# Patient Record
Sex: Male | Born: 1965 | Race: White | Hispanic: No | Marital: Married | Smoking: Never smoker
Health system: Southern US, Community
[De-identification: ages and names within clinical notes are randomized; demographics above are authoritative.]

## PROBLEM LIST (undated history)

## (undated) DIAGNOSIS — G4730 Sleep apnea, unspecified: Secondary | ICD-10-CM

## (undated) DIAGNOSIS — Z8701 Personal history of pneumonia (recurrent): Secondary | ICD-10-CM

## (undated) DIAGNOSIS — J310 Chronic rhinitis: Secondary | ICD-10-CM

## (undated) HISTORY — DX: Personal history of pneumonia (recurrent): Z87.01

## (undated) HISTORY — DX: Sleep apnea, unspecified: G47.30

## (undated) HISTORY — PX: OTHER SURGICAL HISTORY: SHX169

## (undated) HISTORY — DX: Chronic rhinitis: J31.0

---

## 2011-01-06 DIAGNOSIS — Z8701 Personal history of pneumonia (recurrent): Secondary | ICD-10-CM

## 2011-01-06 HISTORY — DX: Personal history of pneumonia (recurrent): Z87.01

## 2015-09-20 DIAGNOSIS — R490 Dysphonia: Secondary | ICD-10-CM | POA: Diagnosis not present

## 2015-09-20 DIAGNOSIS — R1313 Dysphagia, pharyngeal phase: Secondary | ICD-10-CM | POA: Diagnosis not present

## 2015-09-20 MED FILL — FLUTICASONE PROP 50 MCG SPR: 50 | 30 days supply | Qty: 16 | Fill #0

## 2015-09-20 MED FILL — OMEPRAZOLE DR 40 MG CAPSULE: 40 | 30 days supply | Qty: 30 | Fill #0

## 2015-10-10 ENCOUNTER — Institutional Professional Consult (permissible substitution): Payer: Self-pay | Admitting: Pulmonary Disease

## 2015-10-11 ENCOUNTER — Other Ambulatory Visit (INDEPENDENT_AMBULATORY_CARE_PROVIDER_SITE_OTHER): Payer: 59

## 2015-10-11 ENCOUNTER — Ambulatory Visit (INDEPENDENT_AMBULATORY_CARE_PROVIDER_SITE_OTHER)
Admission: RE | Admit: 2015-10-11 | Discharge: 2015-10-11 | Disposition: A | Discharge status: Home / Self Care | Payer: 59 | Source: Ambulatory Visit | Attending: Pulmonary Disease | Admitting: Pulmonary Disease

## 2015-10-11 ENCOUNTER — Other Ambulatory Visit: Payer: Self-pay | Admitting: Pulmonary Disease

## 2015-10-11 ENCOUNTER — Telehealth: Payer: Self-pay | Admitting: Pulmonary Disease

## 2015-10-11 ENCOUNTER — Ambulatory Visit (INDEPENDENT_AMBULATORY_CARE_PROVIDER_SITE_OTHER): Payer: 59 | Admitting: Pulmonary Disease

## 2015-10-11 ENCOUNTER — Encounter: Payer: Self-pay | Admitting: Pulmonary Disease

## 2015-10-11 DIAGNOSIS — R05 Cough: Secondary | ICD-10-CM

## 2015-10-11 DIAGNOSIS — R059 Cough, unspecified: Secondary | ICD-10-CM

## 2015-10-11 DIAGNOSIS — K219 Gastro-esophageal reflux disease without esophagitis: Secondary | ICD-10-CM | POA: Diagnosis not present

## 2015-10-11 DIAGNOSIS — J310 Chronic rhinitis: Secondary | ICD-10-CM | POA: Insufficient documentation

## 2015-10-11 LAB — CBC WITH DIFFERENTIAL/PLATELET
BASOS ABS: 0 10*3/uL (ref 0.0–0.1)
BASOS PCT: 0.4 % (ref 0.0–3.0)
EOS ABS: 0.1 10*3/uL (ref 0.0–0.7)
Eosinophils Relative: 1.2 % (ref 0.0–5.0)
HEMATOCRIT: 45.2 % (ref 39.0–52.0)
HEMOGLOBIN: 15 g/dL (ref 13.0–17.0)
LYMPHS PCT: 26.8 % (ref 12.0–46.0)
Lymphs Abs: 1.8 10*3/uL (ref 0.7–4.0)
MCHC: 33.1 g/dL (ref 30.0–36.0)
MCV: 87 fl (ref 78.0–100.0)
Monocytes Absolute: 0.6 10*3/uL (ref 0.1–1.0)
Monocytes Relative: 9.2 % (ref 3.0–12.0)
Neutro Abs: 4.2 10*3/uL (ref 1.4–7.7)
Neutrophils Relative %: 62.4 % (ref 43.0–77.0)
Platelets: 238 10*3/uL (ref 150.0–400.0)
RBC: 5.19 Mil/uL (ref 4.22–5.81)
RDW: 13.8 % (ref 11.5–15.5)
WBC: 6.7 10*3/uL (ref 4.0–10.5)

## 2015-10-11 MED ORDER — FLUTICASONE PROPIONATE 50 MCG/ACT NA SUSP: 2.0000 | Freq: Every day | NASAL | 2 refills | Status: DC

## 2015-10-11 NOTE — Patient Instructions (Addendum)
   Call me if your cough changes or you develop any new breathing problems.   Try to remember where you had your breathing tests at so we can get your records.  Continue using the Flonase nasal spray.  I will see you back in 4 weeks to review your test results.  TESTS ORDERED: 1. Maxillofacial CT Scan Limited w/o Contrast 2. CXR PA/LAT today 3. Barium Swallow/Esophagram 4. Full PFTs before next appointment 5. Serum CBC w/ differential & RAST Panel

## 2015-10-11 NOTE — Telephone Encounter (Signed)
Pt wanting to know if he should cancel his ENT appt since he is having such an extensive workup through our clinic.    JN please advise on recs. Thanks!

## 2015-10-11 NOTE — Telephone Encounter (Signed)
It's entirely up to him. If he wants to push the appointment back a bit until we get the workup complete. Thanks.

## 2015-10-11 NOTE — Progress Notes (Signed)
Subjective:    Patient ID: Timothy Ho, male    DOB: 1966-01-04, 50 y.o.   MRN: [id number]  HPI The patient reports he has had a cough for months. He reports he has had this problem in the past with spontaneous resolution after long periods of time. He hasn't been able to pinpoint a specific season that it seems to start. He reports previously he did have breathing tests. He reports coughing after drinking a cold beverage or with exposure to cold air. No nocturnal awakenings with coughing. No wheezing or dyspnea. No other environmental triggers. He reports the sensation in his upper throat that he has something that needs coughed out. Reports his cough is nonproductive. He reports he has been evaluated by ENT and had a normal exam. He was started on Flonase which seemed to help the sensation in his throat and Prilosec. He stopped taking Prilosec with diarrhea that has resolved. He didn't notice a significant benefit while taking Prilosec. He reports he has had pneumonia in the past but no history of recurrent bronchitis. He denies any sinus pressure. He reports he does have some sinus congestion. Denies any sinus drainage. No fever, chills, or sweats. He reports he does avoid foods that give him reflux. No morning brash water taste. No other dyspepsia.   Review of Systems No joint swelling, stiffness, or erythema. No dry eyes. Does have dry mouth. Remotely did get oral ulcers but none recentleview of systems is negative except as per the history of presenting illness.  No Known Allergies  No current outpatient prescriptions on file prior to visit.   No current facility-administered medications on file prior to visit.     Past Medical History:  Diagnosis Date  . Chronic rhinitis   . H/O: pneumonia 2013    Past Surgical History:  Procedure Laterality Date  . none      Family History  Problem Relation Age of Onset  . Diabetes Mother   . Crohn's disease Sister   . Lung disease Neg  Hx   . Rheumatologic disease Neg Hx   . Cancer Neg Hx     Social History   Social History  . Marital status: Married    Spouse name: N/A  . Number of children: N/A  . Years of education: N/A   Occupational History  . IT Tech    Social History Main Topics  . Smoking status: Passive Smoke Exposure - Never Smoker  . Smokeless tobacco: Never Used     Comment: Father smoked heavily  . Alcohol use 0.6 - 1.2 oz/week    1 - 2 Cans of beer per week     Comment: Beer once or twice a week  . Drug use: No  . Sexual activity: Not Asked   Other Topics Concern  . None   Social History Narrative   Originally from [place]. He moved to [facility] in December 2016. 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 Does have a dog. No bird exposure. No mold or hot tub exposure. Questionable asbestos exposure as a child.       Objective:   Physical Exam BP (!) 152/86 (BP Location: Right Arm, Patient Position: Sitting, Cuff Size: Normal)   Pulse 80   Ht 6\' 5"  (1.956 m)   Wt (!) 369 lb (167.4 kg)   SpO2 98%   BMI 43.76 kg/m  General:  Awake. Alert.  No distress. Morbidly obese. Integument:  Warm & dry. No rash on exposed skin. No bruising. Lymphatics:  No appreciated cervical or supraclavicular lymphadenoapthy. HEENT:  Moist mucus membranes. No oral ulcers. No scleral injection or icterus. Moderate to severe bilateral nasal turbinate swelling. Cardiovascular:  Regular rate. No edema. No appreciable JVD.  Pulmonary:  Good aeration & clear to auscultation bilaterally. Symmetric chest wall expansion. No accessory muscle use. Abdomen: Soft. Normal bowel sounds. Protuberant. Grossly nontender. Musculoskeletal:  Normal bulk and tone. Hand grip strength 5/5 bilaterally. No joint deformity or effusion appreciated. Neurological:  CN 2-12 grossly in tact. No meningismus. Moving all 4 extremities equally. Symmetric brachioradialis deep tendon  reflexes. Psychiatric:  Mood and affect congruent. Speech normal rhythm, rate & tone.     Assessment & Plan:  50 y.o. male with morbid obesity and long-standing history of a nonproductive cough that seems to be somewhat cyclical. Certainly he could have caught very and asthma however postnasal drainage from allergic rhinitis is slightly more likely given physical exam today. Patient has no symptoms from his underlying reflux with his dietary restriction but this should still be assessed given his history. I instructed the patient contact my office if he had any new breathing problems and he will attempt to obtain/remember where he had his previous pulmonary function testing.  1. Cough: Checking full pulmonary function testing before next appointment. May need to undergo methacholine challenge testing. Also checking chest x-ray PA/LAT today with low threshold to perform CT chest without contrast. 2. Chronic Rhinitis: Continuing Flonase. Checking maxillofacial CT scan without contrast, serum CBC with differential, & RAST panel. 3. GERD: Asymptomatic. Checking barium swallow/esophagogram. 4. Follow-up: Patient to return to clinic in 4 weeks.  Timothy Ho, M.D. [facility] Pager:  [phone] After 3pm or if no response, call [phone] 11:34 AM 10/11/15

## 2015-10-14 LAB — RESPIRATORY ALLERGY PROFILE REGION II ~~LOC~~
Allergen, A. alternata, m6: <0.1 kU/L
Allergen, Cedar tree, t12: <0.1 kU/L
Allergen, D pternoyssinus,d7: 0.22 kU/L — ABNORMAL HIGH
Allergen, Mouse Urine Protein, e78: <0.1 kU/L
Allergen, Oak,t7: <0.1 kU/L
Box Elder IgE: <0.1 kU/L
Cat Dander: <0.1 kU/L
D. farinae: 0.25 kU/L — ABNORMAL HIGH
Dog Dander: <0.1 kU/L
Elm IgE: <0.1 kU/L
Johnson Grass: <0.1 kU/L
Rough Pigweed  IgE: <0.1 kU/L
Sheep Sorrel IgE: <0.1 kU/L

## 2015-10-14 NOTE — Telephone Encounter (Signed)
Spoke with pt, aware of cxr results and ent recs.  Nothing further needed.

## 2015-10-14 NOTE — Telephone Encounter (Signed)
The patient returned call, [phone].

## 2015-10-14 NOTE — Telephone Encounter (Signed)
LMTCB

## 2015-10-18 ENCOUNTER — Ambulatory Visit (INDEPENDENT_AMBULATORY_CARE_PROVIDER_SITE_OTHER)
Admission: RE | Admit: 2015-10-18 | Discharge: 2015-10-18 | Disposition: A | Discharge status: Home / Self Care | Payer: 59 | Source: Ambulatory Visit | Attending: Pulmonary Disease | Admitting: Pulmonary Disease

## 2015-10-18 DIAGNOSIS — J329 Chronic sinusitis, unspecified: Secondary | ICD-10-CM | POA: Diagnosis not present

## 2015-10-18 DIAGNOSIS — R059 Cough, unspecified: Secondary | ICD-10-CM

## 2015-10-18 DIAGNOSIS — R05 Cough: Secondary | ICD-10-CM | POA: Diagnosis not present

## 2015-10-18 DIAGNOSIS — J310 Chronic rhinitis: Secondary | ICD-10-CM

## 2015-10-25 ENCOUNTER — Encounter (INDEPENDENT_AMBULATORY_CARE_PROVIDER_SITE_OTHER): Payer: 59 | Admitting: Pulmonary Disease

## 2015-10-25 ENCOUNTER — Ambulatory Visit (HOSPITAL_COMMUNITY)
Admission: RE | Admit: 2015-10-25 | Discharge: 2015-10-25 | Disposition: A | Discharge status: Home / Self Care | Payer: 59 | Source: Ambulatory Visit | Attending: Pulmonary Disease | Admitting: Pulmonary Disease

## 2015-10-25 DIAGNOSIS — K219 Gastro-esophageal reflux disease without esophagitis: Secondary | ICD-10-CM | POA: Insufficient documentation

## 2015-10-25 DIAGNOSIS — R059 Cough, unspecified: Secondary | ICD-10-CM

## 2015-10-25 DIAGNOSIS — R05 Cough: Secondary | ICD-10-CM | POA: Insufficient documentation

## 2015-10-25 DIAGNOSIS — K224 Dyskinesia of esophagus: Secondary | ICD-10-CM | POA: Insufficient documentation

## 2015-10-25 LAB — PULMONARY FUNCTION TEST
DL/VA % PRED: 88 %
DL/VA: 4.43 ml/min/mmHg/L
DLCO cor % pred: 86 %
DLCO cor: 36.56 ml/min/mmHg
DLCO unc % pred: 90 %
DLCO unc: 38.29 ml/min/mmHg
FEF 25-75 PRE: 5.1 L/s
FEF 25-75 Post: 6.02 L/sec
FEF2575-%CHANGE-POST: 17 %
FEF2575-%PRED-POST: 147 %
FEF2575-%Pred-Pre: 125 %
FEV1-%CHANGE-POST: 3 %
FEV1-%PRED-PRE: 101 %
FEV1-%Pred-Post: 105 %
FEV1-PRE: 4.94 L
FEV1-Post: 5.11 L
FEV1FVC-%CHANGE-POST: 4 %
FEV1FVC-%Pred-Pre: 105 %
FEV6-%CHANGE-POST: 0 %
FEV6-%PRED-PRE: 98 %
FEV6-%Pred-Post: 98 %
FEV6-PRE: 6.03 L
FEV6-Post: 6.01 L
FEV6FVC-%Change-Post: 0 %
FEV6FVC-%PRED-PRE: 102 %
FEV6FVC-%Pred-Post: 102 %
FVC-%CHANGE-POST: 0 %
FVC-%PRED-POST: 95 %
FVC-%PRED-PRE: 95 %
FVC-POST: 6.02 L
FVC-Pre: 6.06 L
POST FEV1/FVC RATIO: 85 %
POST FEV6/FVC RATIO: 100 %
PRE FEV6/FVC RATIO: 100 %
Pre FEV1/FVC ratio: 82 %
RV % PRED: 76 %
RV: 1.91 L
TLC % pred: 94 %
TLC: 8.02 L

## 2015-10-31 ENCOUNTER — Telehealth: Payer: Self-pay | Admitting: Pulmonary Disease

## 2015-10-31 MED ORDER — RANITIDINE HCL 150 MG PO TABS: 150.0000 mg | ORAL_TABLET | Freq: Every day | ORAL | 2 refills | Status: DC

## 2015-10-31 NOTE — Telephone Encounter (Signed)
Notes Recorded by Roslynn AmbleJennings E Nestor, MD on 10/29/2015 at 1:32 PM EDT Please let the patient know that his esophagram did show some mild reflux. We should have him start on Zantac (or Ranitidine) 150mg  po qhs to help suppress this. Thanks.  Notes Recorded by Roslynn AmbleJennings E Nestor, MD on 10/23/2015 at 6:58 PM EDT Please let the patient know that he does have some mild changes in his sinuses from allergies. Thanks. ----------- Pt aware of results/recs.  Zantac sent to preferred pharmacy.  Nothing further needed.

## 2015-11-20 ENCOUNTER — Ambulatory Visit (INDEPENDENT_AMBULATORY_CARE_PROVIDER_SITE_OTHER): Payer: 59 | Admitting: Pulmonary Disease

## 2015-11-20 ENCOUNTER — Encounter: Payer: Self-pay | Admitting: Pulmonary Disease

## 2015-11-20 VITALS — BP 130/80 | HR 96 | Ht 77.0 in | Wt 369.0 lb

## 2015-11-20 DIAGNOSIS — J302 Other seasonal allergic rhinitis: Secondary | ICD-10-CM | POA: Diagnosis not present

## 2015-11-20 DIAGNOSIS — R059 Cough, unspecified: Secondary | ICD-10-CM

## 2015-11-20 DIAGNOSIS — R05 Cough: Secondary | ICD-10-CM | POA: Diagnosis not present

## 2015-11-20 DIAGNOSIS — K219 Gastro-esophageal reflux disease without esophagitis: Secondary | ICD-10-CM | POA: Diagnosis not present

## 2015-11-20 NOTE — Progress Notes (Signed)
Subjective:    Patient ID: Timothy Ho, male    DOB: August 28, 1965, 50 y.o.   MRN: [id number]  C.C.:  Follow-up for Cough, Chronic Rhinitis, & GERD.  HPI Cough: He reports his cough seems to have improved significantly since August/September. He reports no wheezing or dyspnea above his baseline.   Chronic Rhinitis: Previously on Flonase. RAST panel negative. Patient with only minimal mucosal thickening within right frontal nasal sinus. Previously referred to ENT. He does notice any sinus pressure or pain.    GERD: Mild reflux noted on barium swallow without hiatal hernia. Mild esophageal dysmotility as well. Started on Zantac 150mg  qhs since last appointment via phone call. However, he never started it due to a decline in his cough. Denies any reflux or dyspepsia.   Review of Systems Denies any chest pain or pressure. No fever, chills, or sweats. No headache or vision changes.   No Known Allergies  No current outpatient prescriptions on file prior to visit.   No current facility-administered medications on file prior to visit.     Past Medical History:  Diagnosis Date  . Chronic rhinitis   . H/O: pneumonia 2013    Past Surgical History:  Procedure Laterality Date  . none      Family History  Problem Relation Age of Onset  . Diabetes Mother   . Crohn's disease Sister   . Lung disease Neg Hx   . Rheumatologic disease Neg Hx   . Cancer Neg Hx     Social History   Social History  . Marital status: Married    Spouse name: N/A  . Number of children: N/A  . Years of education: N/A   Occupational History  . IT Tech    Social History Main Topics  . Smoking status: Passive Smoke Exposure - Never Smoker  . Smokeless tobacco: Never Used     Comment: Father smoked heavily  . Alcohol use 0.6 - 1.2 oz/week    1 - 2 Cans of beer per week     Comment: Beer once or twice a week  . Drug use: No  . Sexual activity: Not Asked   Other Topics Concern  . None   Social  History Narrative   [place]. 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 Does have a dog. No bird exposure. No mold or hot tub exposure. Questionable asbestos exposure as a child.       Objective:   Physical Exam BP 130/80 (BP Location: Right Arm)   Pulse 96   Ht 6\' 5"  (1.956 m)   Wt (!) 369 lb (167.4 kg)   SpO2 97%   BMI 43.76 kg/m  General:  Awake. Alert. Morbidly obese. Integument:  Warm & dry. No rash on exposed skin.  Lymphatics:  No appreciated cervical or supraclavicular lymphadenoapthy. HEENT:  Moist mucus membranes. No scleral injection or icterus. Moderate to severe bilateral nasal turbinate swelling unchanged. Cardiovascular:  Regular rate. No edema. Normal S1 & S2. Pulmonary: Clear with auscultation bilaterally. Normal work of breathing on room air and speaking in complete sentences. Abdomen: Soft. Normal bowel sounds. Protuberant. Nontender.  PFT 10/25/15: FVC 6.06 L (95%) FEV1 4.94 L (101%) FEV1/FVC 0.82 FEF 25-75 5.10 L (125%) negative bronchodilator response TLC 8.02 L (94%) RV 76% ERV 55% DLCO corrected 86% (Hgb 16.4)  IMAGING BARIUM SWALLOW/ESOPHAGRAM 10/25/15 (per  radiologist): Normal oral and pharyngeal phases swallowing without laryngeal penetration or tracheobronchial aspiration. Mild gastroesophageal reflux without hiatal hernia. Mild esophageal dysmotility characteristic reflux.  MAXILLFACIAL CT SCAN LTD W/O 10/18/15 (per radiologist): 1.6 cm mucous retention cyst or small polyp within right maxillary sinus. Minimal mucosal thickening in the roof of the right frontal sinuses without acute fluid level. Minimal left shift of nasal septum.  CXR PA/LAT 10/11/15 (personally reviewed by me):  Low lung volumes. No focal opacity or mass appreciated. No pleural effusion. Heart normal in size & mediastinum normal in contour.  LABS 10/11/15 CBC:  6.7/15.0/45.2/20. Eosinophils: 0.1 IgE:  <2 RAST Panel:  D farinae 0.25 & D pternoyssinus 0.22    Assessment & Plan:  50 y.o. male with morbid obesity and long-standing history of a nonproductive cough. Cough is likely secondary to postnasal drainage from his seasonal allergic rhinitis. I cannot discount the possible contribution silent laryngo-esophageal reflux but this will need to be assessed directly by ENT. His pulmonary function testing is completely normal and I did review his blood work as well as imaging tests from last appointment. I instructed the patient contact my office if he had any return in his cough or new breathing problems before his next appointment.  1. Cough: Likely secondary to seasonal allergic rhinitis. Patient does have an appointment with ENT for evaluation and hopefully direct laryngoscopy. 2. Chronic Seasonal Rhinitis: Holding off on medications at this time. Consider restarting Flonase and additional therapies depending upon symptoms and the season. 3. GERD: Asymptomatic. Holding on initiation of Zantac. 4. Health Maintenance:  S/P Influenza Vaccine October 2017. 5. Follow-up: Patient to return to clinic in 9 months or sooner if needed.   Donna ChristenJennings E. Jamison NeighborNestor, M.D. [facility] Pager:  [phone] After 3pm or if no response, call [phone] 9:22 AM 11/20/15

## 2015-11-20 NOTE — Patient Instructions (Signed)
   Please call me if you have any new breathing problems or your cough returns before your next appointment.  I will see you back in 9 months or sooner if needed.

## 2017-01-30 DIAGNOSIS — L02214 Cutaneous abscess of groin: Secondary | ICD-10-CM | POA: Diagnosis not present

## 2017-05-19 ENCOUNTER — Telehealth: Payer: Self-pay

## 2017-05-19 NOTE — Telephone Encounter (Signed)
Copied from CRM [id number]. Topic: Appointment Scheduling - New Patient >> May 19, 2017  4:17 PM Arlyss Gandy, NT wrote: New patient has been scheduled for your office. Provider: Dr. Carmelia Roller Date of Appointment: 06/10/17  Route to department's PEC pool.

## 2017-06-05 IMAGING — RF DG ESOPHAGUS
10 of 17 series · 12 of 24 positions shown · non-contrast
Comparison: None.

CLINICAL DATA: 50-year-old male inpatient with a reported history
gastroesophageal reflux disease and unexplained chronic cough.

EXAM:
ESOPHOGRAM / BARIUM SWALLOW / BARIUM TABLET STUDY
TECHNIQUE: Combined double contrast and single contrast examination performed
using effervescent crystals, thick barium liquid, and thin barium
liquid. The patient was observed with fluoroscopy swallowing a 13 mm
barium sulphate tablet.
FLUOROSCOPY TIME:  Fluoroscopy Time:  3 minutes 0 seconds.
Radiation Exposure Index (if provided by the fluoroscopic device):
116.6 mGy.
Number of Acquired Spot Images: 8

[Series 1: cp_standard · 0.34mm/px · 2 of 26 frames shown (1 of 4)]
[frame 4/26]
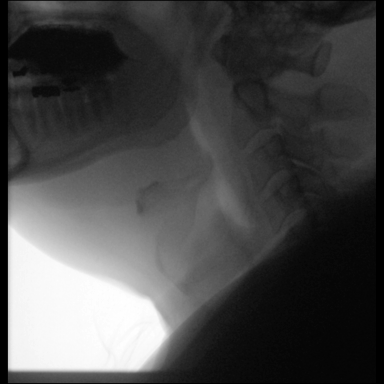
[frame 23/26]
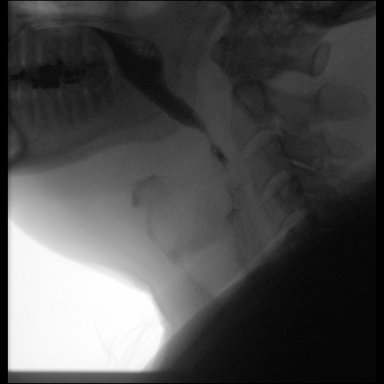

[Series 3: cp_standard · 0.34mm/px · 2 of 26 frames shown (2 of 4)]
[frame 4/26]
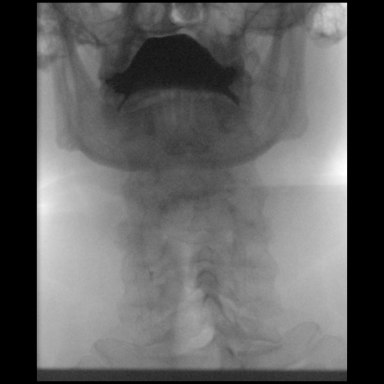
[frame 23/26]
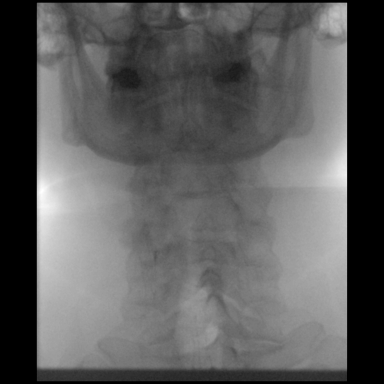

[Series 4: fluoro_barium 2fps_bw · 0.17mm/px · 1 of 2 frames shown (1 of 6)]
[frame 2/2]
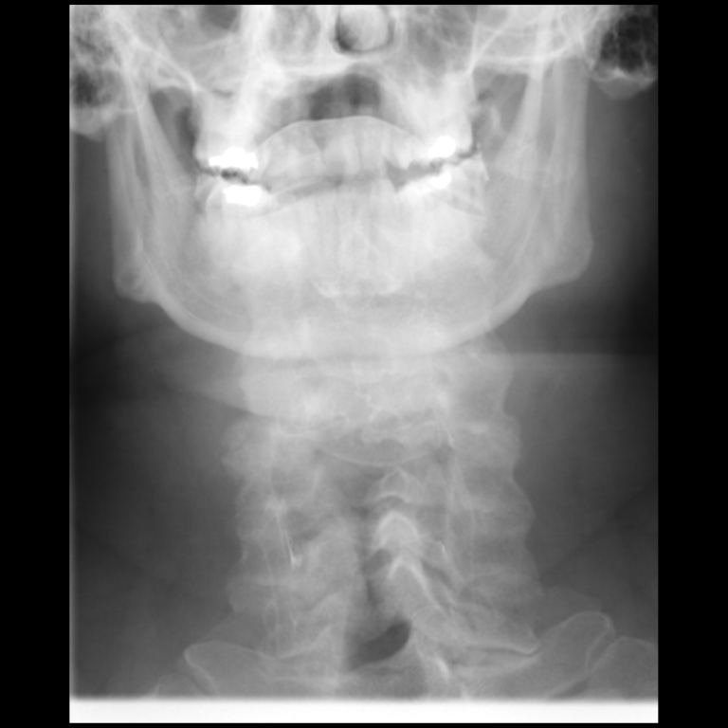

[Series 6: fluoro_barium 2fps_bw · 0.17mm/px · 1 of 1 slices shown (2 of 6)]
[im 1/1]
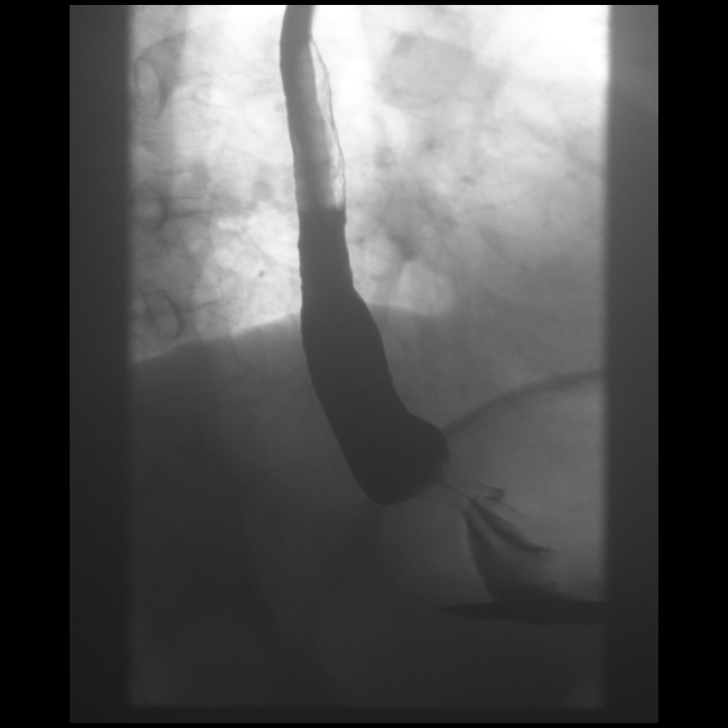

[Series 7: fluoro_barium 2fps_bw · 0.17mm/px · 1 of 2 frames shown (3 of 6)]
[frame 2/2]
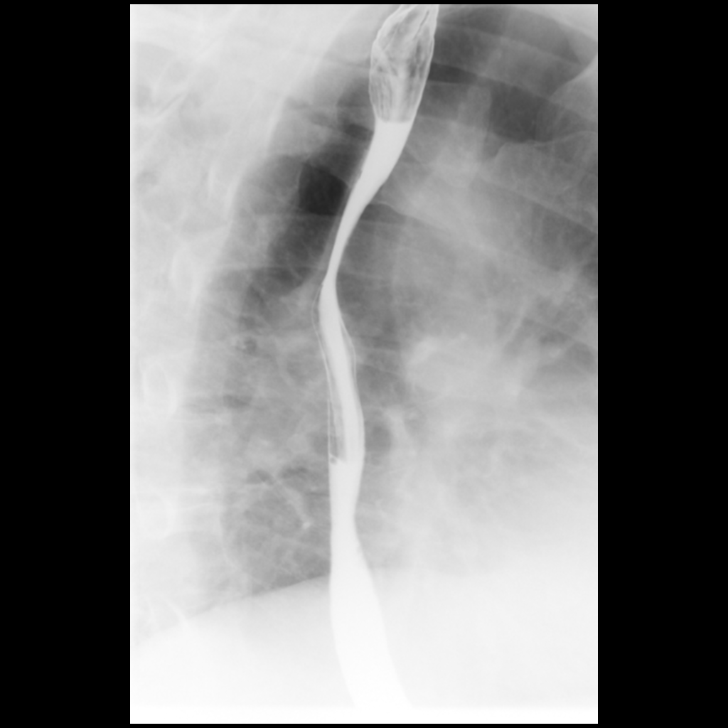

[Series 9: fluoro_barium 2fps_bw · 0.17mm/px · 1 of 1 slices shown (4 of 6)]
[im 1/1]
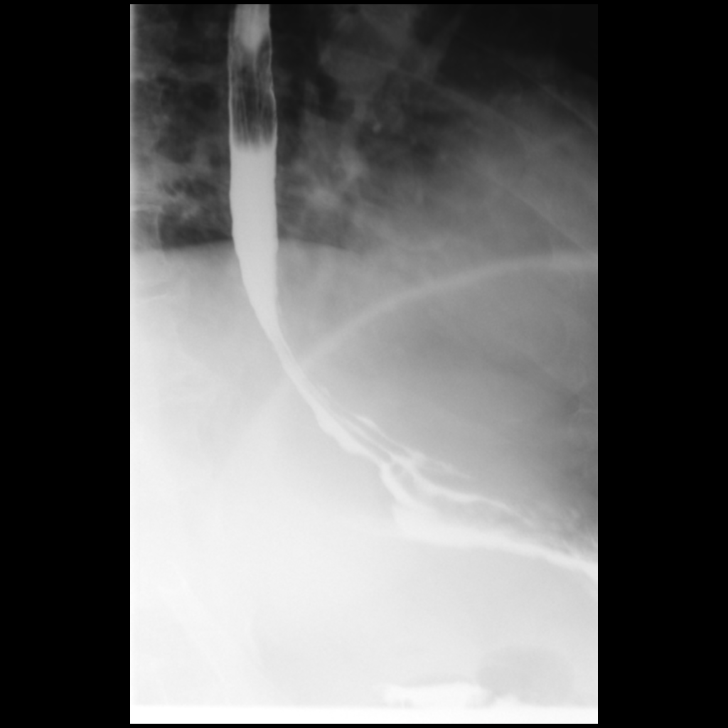

[Series 11: fluoro_barium 2fps_bw · 0.19mm/px · 1 of 1 slices shown (5 of 6)]
[im 1/1]
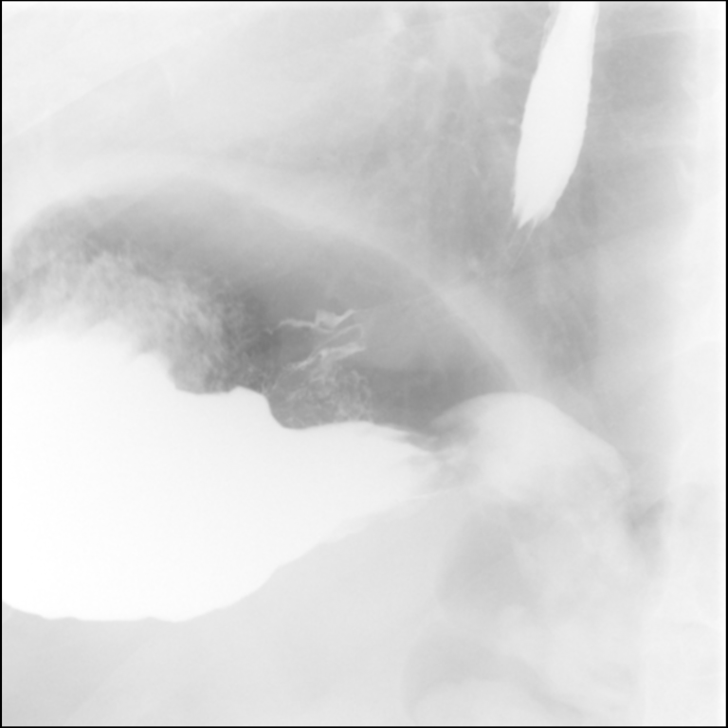

[Series 14: cp_standard · 0.19mm/px · 1 of 1 slices shown (3 of 4)]
[im 1/1]
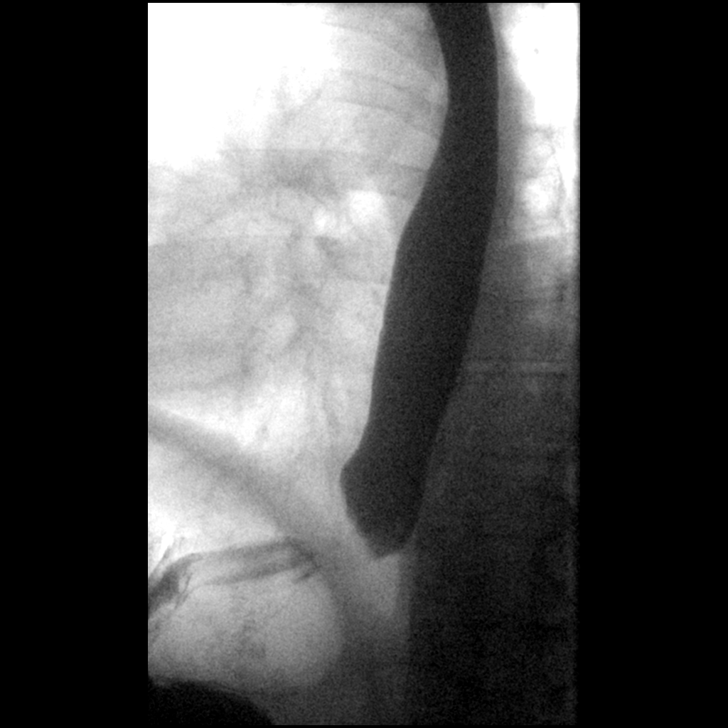

[Series 16: fluoro_barium 2fps_bw · 0.19mm/px · 1 of 1 slices shown (6 of 6)]
[im 1/1]
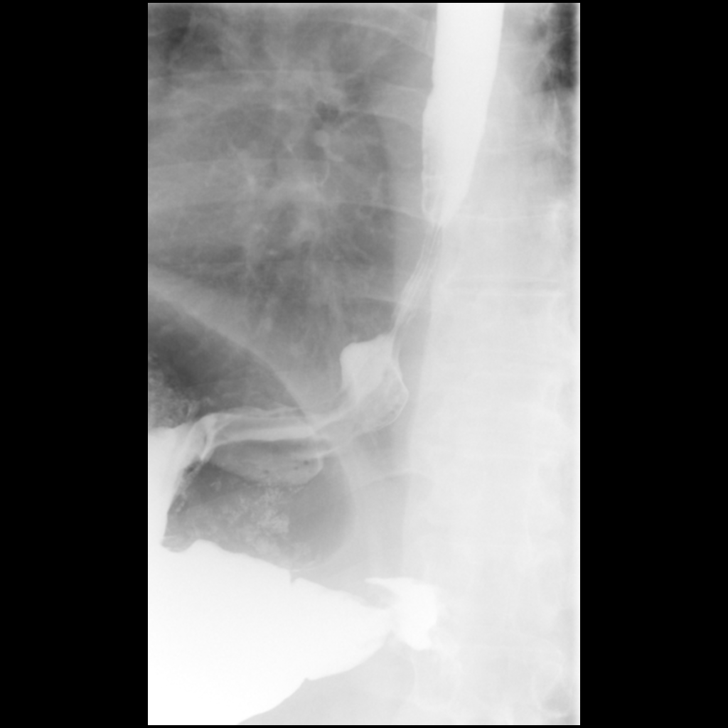

[Series 18: cp_standard · 0.26mm/px · 1 of 1 slices shown (4 of 4)]
[im 1/1]
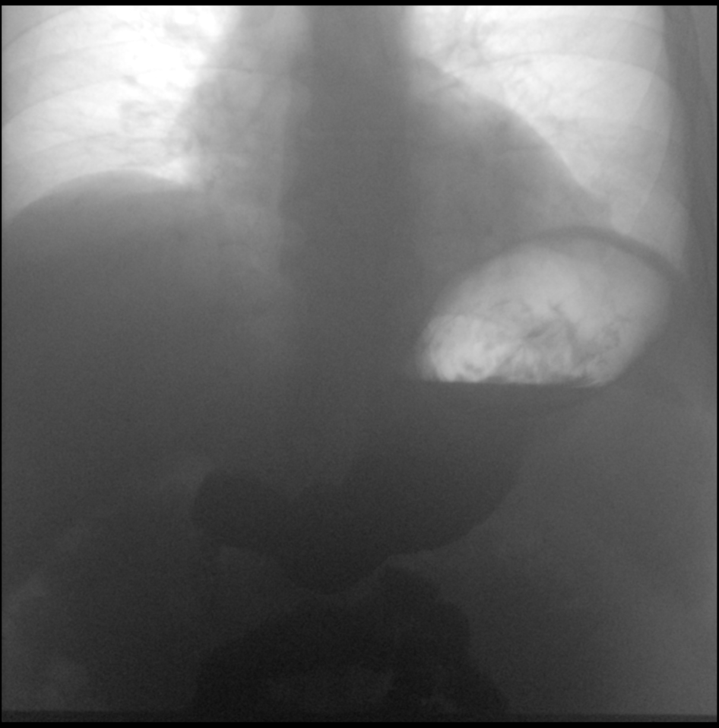

[12 of 24 positions shown; findings below may reference images not displayed]

FINDINGS: The oral and pharyngeal phases of swallowing are normal, with no
laryngeal penetration or tracheobronchial aspiration. No significant
barium retention in the pharynx. No pharyngeal mass, stricture or
diverticulum. No evidence of cricopharyngeus muscle dysfunction.

There is mild intermittent weakening of primary peristalsis in the
mid to lower thoracic esophagus. No hiatal hernia. Mild
gastroesophageal reflux was elicited to the level of the mid
thoracic esophagus with water siphon test. Normal esophageal mucosa,
with no esophageal mass, stricture or ulcer. Gastric cardia appears
normal. A 13 mm swallowed barium tablet traversed the esophagus into
the stomach without delay.
IMPRESSION: 1. Normal oral and pharyngeal phase of swallowing, with no laryngeal
penetration or tracheobronchial aspiration.
2. Mild gastroesophageal reflux.  No hiatal hernia.
3. Mild esophageal dysmotility, characteristic of reflux related
dysmotility.
4. Otherwise normal esophagus with no evidence of reflux esophagitis
and no esophageal mass, stricture or ulcer.

## 2017-06-10 ENCOUNTER — Encounter: Payer: Self-pay | Admitting: Family Medicine

## 2017-06-10 ENCOUNTER — Ambulatory Visit (INDEPENDENT_AMBULATORY_CARE_PROVIDER_SITE_OTHER): Payer: 59 | Admitting: Family Medicine

## 2017-06-10 VITALS — BP 130/82 | HR 90 | Temp 98.6°F | Ht 77.0 in | Wt 364.0 lb

## 2017-06-10 DIAGNOSIS — Z114 Encounter for screening for human immunodeficiency virus [HIV]: Secondary | ICD-10-CM | POA: Diagnosis not present

## 2017-06-10 DIAGNOSIS — Z0000 Encounter for general adult medical examination without abnormal findings: Secondary | ICD-10-CM

## 2017-06-10 DIAGNOSIS — Z1159 Encounter for screening for other viral diseases: Secondary | ICD-10-CM | POA: Diagnosis not present

## 2017-06-10 DIAGNOSIS — K635 Polyp of colon: Secondary | ICD-10-CM | POA: Diagnosis not present

## 2017-06-10 NOTE — Progress Notes (Signed)
Chief Complaint  Patient presents with  . New Patient (Initial Visit)    Well Male Timothy Ho is here for a complete physical.   His last physical was >1 year ago.  Current diet: in general, a "horrible" diet.  Current exercise: go to [employer] on weekends Weight trend: stable Does pt snore? No. Daytime fatigue? No. Seat belt? Yes.    Health maintenance Shingrix- No Colonoscopy- Yes 03/05/2013, due for f/u Tetanus- Yes - 05/06/14 HIV- No Hep C- No Prostate cancer screening- No   Past Medical History:  Diagnosis Date  . Chronic rhinitis   . H/O: pneumonia 2013      Past Surgical History:  Procedure Laterality Date  . none      Medications  Takes no meds routinely.   Allergies No Known Allergies  Family History Family History  Problem Relation Age of Onset  . Diabetes Mother   . Crohn's disease Sister   . Lung disease Neg Hx   . Rheumatologic disease Neg Hx   . Cancer Neg Hx     Review of Systems: Constitutional:  no fevers Eye:  no recent significant change in vision Ear/Nose/Mouth/Throat:  Ears:  no hearing loss Nose/Mouth/Throat:  no complaints of nasal congestion, no sore throat Cardiovascular:  no chest pain, no palpitations Respiratory:  no cough and no shortness of breath Gastrointestinal:  no abdominal pain, no change in bowel habits GU:  Male: negative for dysuria, frequency, and incontinence and negative for prostate symptoms Musculoskeletal/Extremities:  no pain, redness, or swelling of the joints Integumentary (Skin/Breast):  no abnormal skin lesions reported Neurologic:  no headaches Endocrine: No unexpected weight changes Hematologic/Lymphatic:  no abnormal bleeding  Exam BP 130/82 (BP Location: Left Arm, Patient Position: Sitting, Cuff Size: Large)   Pulse 90   Temp 98.6 F (37 C) (Oral)   Ht 6\' 5"  (1.956 m)   Wt (!) 364 lb (165.1 kg)   SpO2 96%   BMI 43.16 kg/m  General:  well developed, well nourished, in no apparent  distress Skin:  no significant moles, warts, or growths Head:  no masses, lesions, or tenderness Eyes:  pupils equal and round, sclera anicteric without injection Ears:  canals without lesions, TMs shiny without retraction, no obvious effusion, no erythema Nose:  nares patent, septum midline, mucosa normal Throat/Pharynx:  lips and gingiva without lesion; tongue and uvula midline; non-inflamed pharynx; no exudates or postnasal drainage Neck: neck supple without adenopathy, thyromegaly, or masses Lungs:  clear to auscultation, breath sounds equal bilaterally, no respiratory distress Cardio:  regular rate and rhythm, no LE edema, no bruits Abdomen:  abdomen soft, nontender; bowel sounds normal; no masses or organomegaly Genital (male): circumcised penis, no lesions or discharge; testes present bilaterally without masses or tenderness Rectal: Deferred Musculoskeletal:  symmetrical muscle groups noted without atrophy or deformity Extremities:  no clubbing, cyanosis, or edema, no deformities, no skin discoloration Neuro:  gait normal; deep tendon reflexes normal and symmetric Psych: well oriented with normal range of affect and appropriate judgment/insight  Assessment and Plan  Well adult exam - Plan: Comprehensive metabolic panel, Lipid panel  Polyp of colon, unspecified part of colon, unspecified type - Plan: Ambulatory referral to Gastroenterology  Screening for HIV (human immunodeficiency virus) - Plan: HIV antibody  Encounter for hepatitis C screening test for low risk patient - Plan: Hepatitis C antibody  Morbid obesity (HCC)   Well 52 y.o. male.  Counseled on diet and exercise. Counseled on risks and benefits of  prostate cancer screening with PSA. The patient agrees to forego testing for now and would like to think about it. Immunizations, labs, and further orders as above. He is not fasting.  Follow up 1 yr or prn. The patient voiced understanding and agreement to the  plan.  Jilda Roche [place], DO 06/10/17 3:46 PM

## 2017-06-10 NOTE — Patient Instructions (Addendum)
Call your pharmacy to see about the availability of the new shingles vaccine (Shingrix).  Aim to do some physical exertion for 150 minutes per week. This is typically divided into 5 days per week, 30 minutes per day. The activity should be enough to get your heart rate up. Anything is better than nothing if you have time constraints.  If you do not hear anything about your referral in the next 1-2 weeks, call our office and ask for an update.  Healthy Eating Plan Many factors influence your heart health, including eating and exercise habits. Heart (coronary) risk increases with abnormal blood fat (lipid) levels. Heart-healthy meal planning includes limiting unhealthy fats, increasing healthy fats, and making other small dietary changes. This includes maintaining a healthy body weight to help keep lipid levels within a normal range.  WHAT IS MY PLAN?  Your health care provider recommends that you:  Drink a glass of water before meals to help with satiety.  Eat slowly.  An alternative to the water is to add Metamucil. This will help with satiety as well. It does contain calories, unlike water.  WHAT TYPES OF FAT SHOULD I CHOOSE?  Choose healthy fats more often. Choose monounsaturated and polyunsaturated fats, such as olive oil and canola oil, flaxseeds, walnuts, almonds, and seeds.  Eat more omega-3 fats. Good choices include salmon, mackerel, sardines, tuna, flaxseed oil, and ground flaxseeds. Aim to eat fish at least two times each week.  Avoid foods with partially hydrogenated oils in them. These contain trans fats. Examples of foods that contain trans fats are stick margarine, some tub margarines, cookies, crackers, and other baked goods. If you are going to avoid a fat, this is the one to avoid!  WHAT GENERAL GUIDELINES DO I NEED TO FOLLOW?  Check food labels carefully to identify foods with trans fats. Avoid these types of options when possible.  Fill one half of your plate with  vegetables and green salads. Eat 4-5 servings of vegetables per day. A serving of vegetables equals 1 cup of raw leafy vegetables,  cup of raw or cooked cut-up vegetables, or  cup of vegetable juice.  Fill one fourth of your plate with whole grains. Look for the word "whole" as the first word in the ingredient list.  Fill one fourth of your plate with lean protein foods.  Eat 4-5 servings of fruit per day. A serving of fruit equals one medium whole fruit,  cup of dried fruit,  cup of fresh, frozen, or canned fruit. Try to avoid fruits in cups/syrups as the sugar content can be high.  Eat more foods that contain soluble fiber. Examples of foods that contain this type of fiber are apples, broccoli, carrots, beans, peas, and barley. Aim to get 20-30 g of fiber per day.  Eat more home-cooked food and less restaurant, buffet, and fast food.  Limit or avoid alcohol.  Limit foods that are high in starch and sugar.  Avoid fried foods when able.  Cook foods by using methods other than frying. Baking, boiling, grilling, and broiling are all great options. Other fat-reducing suggestions include: ? Removing the skin from poultry. ? Removing all visible fats from meats. ? Skimming the fat off of stews, soups, and gravies before serving them. ? Steaming vegetables in water or broth.  Lose weight if you are overweight. Losing just 5-10% of your initial body weight can help your overall health and prevent diseases such as diabetes and heart disease.  Increase your consumption of  nuts, legumes, and seeds to 4-5 servings per week. One serving of dried beans or legumes equals  cup after being cooked, one serving of nuts equals 1 ounces, and one serving of seeds equals  ounce or 1 tablespoon.  WHAT ARE GOOD FOODS CAN I EAT? Grains Grainy breads (try to find bread that is 3 g of fiber per slice or greater), oatmeal, light popcorn. Whole-grain cereals. Rice and pasta, including brown rice and those  that are made with whole wheat. Edamame pasta is a great alternative to grain pasta. It has a higher protein content. Try to avoid significant consumption of white bread, sugary cereals, or pastries/baked goods.  Vegetables All vegetables. Cooked white potatoes do not count as vegetables.  Fruits All fruits, but limit pineapple and bananas as these fruits have a higher sugar content.  Meats and Other Protein Sources Lean, well-trimmed beef, veal, pork, and lamb. Chicken and [place] without skin. All fish and shellfish. Wild duck, rabbit, pheasant, and venison. Egg whites or low-cholesterol egg substitutes. Dried beans, peas, lentils, and tofu.Seeds and most nuts.  Dairy Low-fat or nonfat cheeses, including ricotta, string, and mozzarella. Skim or 1% milk that is liquid, powdered, or evaporated. Buttermilk that is made with low-fat milk. Nonfat or low-fat yogurt. Soy/Almond milk are good alternatives if you cannot handle dairy.  Beverages Water is the best for you. Sports drinks with less sugar are more desirable unless you are a highly active athlete.  Sweets and Desserts Sherbets and fruit ices. Honey, jam, marmalade, jelly, and syrups. Dark chocolate.  Eat all sweets and desserts in moderation.  Fats and Oils Nonhydrogenated (trans-free) margarines. Vegetable oils, including soybean, sesame, sunflower, olive, peanut, safflower, corn, canola, and cottonseed. Salad dressings or mayonnaise that are made with a vegetable oil. Limit added fats and oils that you use for cooking, baking, salads, and as spreads.  Other Cocoa powder. Coffee and tea. Most condiments.  The items listed above may not be a complete list of recommended foods or beverages. Contact your dietitian for more options.

## 2017-06-10 NOTE — Progress Notes (Signed)
Pre visit review using our clinic review tool, if applicable. No additional management support is needed unless otherwise documented below in the visit note. 

## 2017-06-11 LAB — COMPREHENSIVE METABOLIC PANEL
ALT: 32 U/L (ref 0–53)
AST: 24 U/L (ref 0–37)
Albumin: 4.3 g/dL (ref 3.5–5.2)
Alkaline Phosphatase: 85 U/L (ref 39–117)
BILIRUBIN TOTAL: 0.5 mg/dL (ref 0.2–1.2)
BUN: 14 mg/dL (ref 6–23)
CALCIUM: 9.5 mg/dL (ref 8.4–10.5)
CO2: 28 meq/L (ref 19–32)
CREATININE: 0.9 mg/dL (ref 0.40–1.50)
Chloride: 105 mEq/L (ref 96–112)
GFR: 93.36 mL/min (ref >60.00)
GLUCOSE: 94 mg/dL (ref 70–99)
Potassium: 4.1 mEq/L (ref 3.5–5.1)
Sodium: 142 mEq/L (ref 135–145)
Total Protein: 7 g/dL (ref 6.0–8.3)

## 2017-06-11 LAB — LIPID PANEL
CHOL/HDL RATIO: 6
Cholesterol: 194 mg/dL (ref 0–200)
HDL: 34.9 mg/dL — ABNORMAL LOW (ref >39.00)
LDL Cholesterol: 128 mg/dL — ABNORMAL HIGH (ref 0–99)
NONHDL: 159.35
Triglycerides: 156 mg/dL — ABNORMAL HIGH (ref 0.0–149.0)
VLDL: 31.2 mg/dL (ref 0.0–40.0)

## 2017-06-11 LAB — HEPATITIS C ANTIBODY
HEP C AB: NONREACTIVE
SIGNAL TO CUT-OFF: 0.03 (ref <1.00)

## 2017-06-11 LAB — HIV ANTIBODY (ROUTINE TESTING W REFLEX): HIV: NONREACTIVE

## 2017-06-16 ENCOUNTER — Encounter: Payer: Self-pay | Admitting: Family Medicine

## 2017-06-17 ENCOUNTER — Telehealth: Payer: Self-pay | Admitting: Family Medicine

## 2017-06-17 NOTE — Telephone Encounter (Signed)
Copied from CRM [id number]. Topic: Quick Communication - Lab Results >> Jun 14, 2017  9:17 AM Ewing, Vladimir Croftsobin B, CMA wrote: Called patient to inform them of 06/14/2017 lab results. When patient returns call, triage nurse may disclose results.

## 2017-06-17 NOTE — Telephone Encounter (Signed)
Patient called, left VM to return call to the office for lab results, advised the results were mailed as well.

## 2017-07-12 ENCOUNTER — Encounter: Payer: Self-pay | Admitting: Family Medicine

## 2017-07-14 ENCOUNTER — Telehealth: Payer: Self-pay

## 2017-07-14 NOTE — Telephone Encounter (Signed)
Author phoned pt. to see if he was still interested in colonoscopy per Dr. Hollie BeachWendling's order. No answer, left VM to call back #[phone] to notify us regarding pt's preference.

## 2017-07-22 ENCOUNTER — Telehealth: Payer: Self-pay | Admitting: Family Medicine

## 2017-07-22 NOTE — Telephone Encounter (Signed)
Copied from CRM [id number]. Topic: Referral - Question >> Jul 22, 2017 12:28 PM Terisa Starraylor, Brittany L wrote: Reason for CRM: Patient's wife would like the GI referral to be changed to [facility]. They will not give him the appointment because the referral says " [facility] ". Please advise. Call back @ [phone]. Can this be done as soon as possible so they can lock in the appt? She would like a call back when this is done

## 2017-07-22 NOTE — Telephone Encounter (Signed)
That's fine to do. Ty.  

## 2017-07-22 NOTE — Telephone Encounter (Signed)
Referral changed to [facility]

## 2017-07-29 ENCOUNTER — Telehealth: Payer: Self-pay | Admitting: Family Medicine

## 2017-07-29 NOTE — Telephone Encounter (Unsigned)
Copied from CRM [id number]. Topic: Referral - Request >> Jul 29, 2017  9:52 AM Floria RavelingStovall, Shana A wrote: Reason for CRM:  pt wife called in stated that pt has to have a sleep study done and [facility]  Pt wife stated that he as a shallow breathing when he sleeps  Best number [phone]

## 2017-08-02 ENCOUNTER — Other Ambulatory Visit: Payer: Self-pay | Admitting: Family Medicine

## 2017-08-02 DIAGNOSIS — R0681 Apnea, not elsewhere classified: Secondary | ICD-10-CM

## 2017-08-12 ENCOUNTER — Other Ambulatory Visit: Payer: Self-pay

## 2017-08-12 DIAGNOSIS — Z86010 Personal history of colonic polyps: Secondary | ICD-10-CM

## 2017-09-01 MED FILL — SUPREP BOWEL PREP KIT: 17.5-3.13-1 | 1 days supply | Qty: 354 | Fill #0

## 2017-09-02 ENCOUNTER — Encounter: Payer: Self-pay | Admitting: *Deleted

## 2017-09-02 ENCOUNTER — Other Ambulatory Visit: Payer: Self-pay

## 2017-09-09 NOTE — Discharge Instructions (Signed)
General Anesthesia, Adult, Care After °These instructions provide you with information about caring for yourself after your procedure. Your health care provider may also give you more specific instructions. Your treatment has been planned according to current medical practices, but problems sometimes occur. Call your health care provider if you have any problems or questions after your procedure. °What can I expect after the procedure? °After the procedure, it is common to have: °· Vomiting. °· A sore throat. °· Mental slowness. ° °It is common to feel: °· Nauseous. °· Cold or shivery. °· Sleepy. °· Tired. °· Sore or achy, even in parts of your body where you did not have surgery. ° °Follow these instructions at home: °For at least 24 hours after the procedure: °· Do not: °? Participate in activities where you could fall or become injured. °? Drive. °? Use heavy machinery. °? Drink alcohol. °? Take sleeping pills or medicines that cause drowsiness. °? Make important decisions or sign legal documents. °? Take care of children on your own. °· Rest. °Eating and drinking °· If you vomit, drink water, juice, or soup when you can drink without vomiting. °· Drink enough fluid to keep your urine clear or pale yellow. °· Make sure you have little or no nausea before eating solid foods. °· Follow the diet recommended by your health care provider. °General instructions °· Have a responsible adult stay with you until you are awake and alert. °· Return to your normal activities as told by your health care provider. Ask your health care provider what activities are safe for you. °· Take over-the-counter and prescription medicines only as told by your health care provider. °· If you smoke, do not smoke without supervision. °· Keep all follow-up visits as told by your health care provider. This is important. °Contact a health care provider if: °· You continue to have nausea or vomiting at home, and medicines are not helpful. °· You  cannot drink fluids or start eating again. °· You cannot urinate after 8-12 hours. °· You develop a skin rash. °· You have fever. °· You have increasing redness at the site of your procedure. °Get help right away if: °· You have difficulty breathing. °· You have chest pain. °· You have unexpected bleeding. °· You feel that you are having a life-threatening or urgent problem. °This information is not intended to replace advice given to you by your health care provider. Make sure you discuss any questions you have with your health care provider. °Document Released: 03/30/2000 Document Revised: 05/27/2015 Document Reviewed: 12/06/2014 °Elsevier Interactive Patient Education © 2018 [employer]vier Inc[employer] ° °

## 2017-09-10 ENCOUNTER — Ambulatory Visit: Payer: 59 | Admitting: Medical

## 2017-09-10 ENCOUNTER — Ambulatory Visit
Admission: RE | Admit: 2017-09-10 | Discharge: 2017-09-10 | Disposition: A | Discharge status: Home / Self Care | Payer: 59 | Source: Ambulatory Visit | Attending: Gastroenterology | Admitting: Gastroenterology

## 2017-09-10 ENCOUNTER — Ambulatory Visit: Payer: 59 | Admitting: Anesthesiology

## 2017-09-10 ENCOUNTER — Encounter: Payer: Self-pay | Admitting: Medical

## 2017-09-10 ENCOUNTER — Encounter
Admission: RE | Disposition: A | Discharge status: Home / Self Care | Payer: Self-pay | Source: Ambulatory Visit | Attending: Gastroenterology

## 2017-09-10 VITALS — BP 124/79 | HR 80 | Temp 98.1°F | Resp 18 | Ht 77.0 in | Wt 364.4 lb

## 2017-09-10 DIAGNOSIS — K640 First degree hemorrhoids: Secondary | ICD-10-CM | POA: Diagnosis not present

## 2017-09-10 DIAGNOSIS — Z6841 Body Mass Index (BMI) 40.0 and over, adult: Secondary | ICD-10-CM | POA: Insufficient documentation

## 2017-09-10 DIAGNOSIS — Z860100 Personal history of colon polyps, unspecified: Secondary | ICD-10-CM

## 2017-09-10 DIAGNOSIS — Z09 Encounter for follow-up examination after completed treatment for conditions other than malignant neoplasm: Secondary | ICD-10-CM | POA: Insufficient documentation

## 2017-09-10 DIAGNOSIS — L089 Local infection of the skin and subcutaneous tissue, unspecified: Secondary | ICD-10-CM

## 2017-09-10 HISTORY — PX: COLONOSCOPY WITH PROPOFOL: SHX5780

## 2017-09-10 SURGERY — COLONOSCOPY WITH PROPOFOL
Anesthesia: General

## 2017-09-10 MED ORDER — ACETAMINOPHEN 325 MG PO TABS: 325.0000 mg | ORAL_TABLET | ORAL | Status: DC | PRN

## 2017-09-10 MED ORDER — ACETAMINOPHEN 160 MG/5ML PO SOLN: 325.0000 mg | ORAL | Status: DC | PRN

## 2017-09-10 MED ORDER — PROPOFOL 10 MG/ML IV BOLUS
INTRAVENOUS | Status: DC | PRN
  Administered 2017-09-10: 50 mg via INTRAVENOUS
  Administered 2017-09-10 (×2): 20 mg via INTRAVENOUS
  Administered 2017-09-10: 90 mg via INTRAVENOUS
  Administered 2017-09-10: 30 mg via INTRAVENOUS
  Administered 2017-09-10: 60 mg via INTRAVENOUS
  Administered 2017-09-10: 50 mg via INTRAVENOUS
  Administered 2017-09-10 (×2): 30 mg via INTRAVENOUS
  Administered 2017-09-10: 60 mg via INTRAVENOUS
  Administered 2017-09-10: 40 mg via INTRAVENOUS
  Administered 2017-09-10: 50 mg via INTRAVENOUS

## 2017-09-10 MED ORDER — MUPIROCIN 2 % EX OINT: TOPICAL_OINTMENT | CUTANEOUS | 0 refills | Status: DC

## 2017-09-10 MED ORDER — LIDOCAINE HCL (CARDIAC) PF 100 MG/5ML IV SOSY
PREFILLED_SYRINGE | INTRAVENOUS | Status: DC | PRN
  Administered 2017-09-10: 40 mg via INTRAVENOUS

## 2017-09-10 MED ORDER — SODIUM CHLORIDE 0.9 % IV SOLN: INTRAVENOUS | Status: DC

## 2017-09-10 MED ORDER — DOXYCYCLINE HYCLATE 100 MG PO TABS: 100.0000 mg | ORAL_TABLET | Freq: Two times a day (BID) | ORAL | 0 refills | Status: DC

## 2017-09-10 MED ORDER — LACTATED RINGERS IV SOLN
INTRAVENOUS | Status: DC
  Administered 2017-09-10: 12:00:00 via INTRAVENOUS

## 2017-09-10 MED FILL — DOXYCYCLINE HYCLATE 100 MG: 100 | 10 days supply | Qty: 20 | Fill #0

## 2017-09-10 MED FILL — MUPIROCIN 2% OINTMENT: 2 | 10 days supply | Qty: 22 | Fill #0

## 2017-09-10 SURGICAL SUPPLY — 5 items
CANISTER SUCT 1200ML W/VALVE (MISCELLANEOUS) ×3 IMPLANT
GOWN CVR UNV OPN BCK APRN NK (MISCELLANEOUS) ×2 IMPLANT
GOWN ISOL THUMB LOOP REG UNIV (MISCELLANEOUS) ×4
KIT ENDO PROCEDURE OLY (KITS) ×3 IMPLANT
WATER STERILE IRR 250ML POUR (IV SOLUTION) ×3 IMPLANT

## 2017-09-10 NOTE — Progress Notes (Signed)
Subjective:    Patient ID: Timothy Ho, male    DOB: Apr 22, 1965, 52 y.o.   MRN: [id number]  HPI  Rt lateral calf spot that came up on sept 1st, 2019. Pt does work out at gym. Pt does sit in massage chair that massages calfs. Wife speculates that it may be mrsa. She notes she has hx of mrsa. Pt does not remember any bite.   Area is tender. Does not itch. No dc reported.    Past Medical History:  Diagnosis Date  . Chronic rhinitis   . H/O: pneumonia 2013     Social History   Socioeconomic History  . Marital status: Married    Spouse name: Not on file  . Number of children: Not on file  . Years of education: Not on file  . Highest education level: Not on file  Occupational History  . Occupation: Stage manager  Social Needs  . Financial resource strain: Not on file  . Food insecurity:    Worry: Not on file    Inability: Not on file  . Transportation needs:    Medical: Not on file    Non-medical: Not on file  Tobacco Use  . Smoking status: Never Smoker  . Smokeless tobacco: Never Used  . Tobacco comment: Father smoked heavily  Substance and Sexual Activity  . Alcohol use: Yes    Alcohol/week: 1.0 - 2.0 standard drinks    Types: 1 - 2 Cans of beer per week    Comment: Beer once or twice a week  . Drug use: No  . Sexual activity: Not on file  Lifestyle  . Physical activity:    Days per week: Not on file    Minutes per session: Not on file  . Stress: Not on file  Relationships  . Social connections:    Talks on phone: Not on file    Gets together: Not on file    Attends religious service: Not on file    Active member of club or organization: Not on file    Attends meetings of clubs or organizations: Not on file    Relationship status: Not on file  . Intimate partner violence:    Fear of current or ex partner: Not on file    Emotionally abused: Not on file    Physically abused: Not on file    Forced sexual activity: Not on file  Other Topics Concern  . Not on  file  Social History Narrative   Originally from [place]. He moved to [facility] in December 2016. 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 Does have a dog. No bird exposure. No mold or hot tub exposure. Questionable asbestos exposure as a child.     Past Surgical History:  Procedure Laterality Date  . COLONOSCOPY WITH PROPOFOL N/A 09/10/2017   Procedure: COLONOSCOPY WITH PROPOFOL;  Surgeon: Midge Minium, MD;  Location: [facility];  Service: Endoscopy;  Laterality: N/A;  . none      Family History  Problem Relation Age of Onset  . Diabetes Mother   . Crohn's disease Sister   . Lung disease Neg Hx   . Rheumatologic disease Neg Hx   . Cancer Neg Hx     No Known Allergies  No current outpatient medications on file prior to visit.   No current facility-administered medications on file prior to visit.     BP  124/79 (BP Location: Left Arm, Patient Position: Sitting, Cuff Size: Normal)   Pulse 80   Temp 98.1 F (36.7 C) (Oral)   Resp 18   Ht 6\' 5"  (1.956 m)   Wt (!) 364 lb 6.4 oz (165.3 kg)   SpO2 98%   BMI 43.21 kg/m    Review of Systems  Constitutional: Negative for chills, fatigue and fever.  Respiratory: Negative for cough, chest tightness, shortness of breath and wheezing.   Gastrointestinal: Negative for abdominal pain.  Skin:       See hpi.  Neurological: Negative for dizziness and headaches.  Hematological: Negative for adenopathy. Does not bruise/bleed easily.    Past Medical History:  Diagnosis Date  . Chronic rhinitis   . H/O: pneumonia 2013     Social History   Socioeconomic History  . Marital status: Married    Spouse name: Not on file  . Number of children: Not on file  . Years of education: Not on file  . Highest education level: Not on file  Occupational History  . Occupation: Stage manager  Social Needs  . Financial resource strain: Not on file  . Food insecurity:    Worry: Not on file     Inability: Not on file  . Transportation needs:    Medical: Not on file    Non-medical: Not on file  Tobacco Use  . Smoking status: Never Smoker  . Smokeless tobacco: Never Used  . Tobacco comment: Father smoked heavily  Substance and Sexual Activity  . Alcohol use: Yes    Alcohol/week: 1.0 - 2.0 standard drinks    Types: 1 - 2 Cans of beer per week    Comment: Beer once or twice a week  . Drug use: No  . Sexual activity: Not on file  Lifestyle  . Physical activity:    Days per week: Not on file    Minutes per session: Not on file  . Stress: Not on file  Relationships  . Social connections:    Talks on phone: Not on file    Gets together: Not on file    Attends religious service: Not on file    Active member of club or organization: Not on file    Attends meetings of clubs or organizations: Not on file    Relationship status: Not on file  . Intimate partner violence:    Fear of current or ex partner: Not on file    Emotionally abused: Not on file    Physically abused: Not on file    Forced sexual activity: Not on file  Other Topics Concern  . Not on file  Social History Narrative   Originally from [place]. He moved to [facility] in December 2016. 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 Does have a dog. No bird exposure. No mold or hot tub exposure. Questionable asbestos exposure as a child.     Past Surgical History:  Procedure Laterality Date  . COLONOSCOPY WITH PROPOFOL N/A 09/10/2017   Procedure: COLONOSCOPY WITH PROPOFOL;  Surgeon: Midge Minium, MD;  Location: [facility];  Service: Endoscopy;  Laterality: N/A;  . none      Family History  Problem Relation Age of Onset  . Diabetes Mother   . Crohn's disease Sister   . Lung disease Neg Hx   . Rheumatologic disease Neg Hx   . Cancer Neg Hx  No Known Allergies  No current outpatient medications on file prior to visit.   No current  facility-administered medications on file prior to visit.     BP 124/79 (BP Location: Left Arm, Patient Position: Sitting, Cuff Size: Normal)   Pulse 80   Temp 98.1 F (36.7 C) (Oral)   Resp 18   Ht 6\' 5"  (1.956 m)   Wt (!) 364 lb 6.4 oz (165.3 kg)   SpO2 98%   BMI 43.21 kg/m       Objective:   Physical Exam  General- No acute distress. Pleasant patient. Neck- Full range of motion, no jvd Lungs- Clear, even and unlabored. Heart- regular rate and rhythm. Neurologic- CNII- XII grossly intact.  Skin- lateral mid aspect of calf. 6 mm scab in middle slight redness around edge. No tick seen. No dc from middle. Area feels mild indurated and tender.        Assessment & Plan:  You do you do appear to have a skin infection.  Bacteria could be normal standard type but even MRSA is possible.  On discussion some speculation of potential tick bite though no portion of tick seen.  Did prescribe doxycycline antibiotic.  Rx advisement given.  Also topical mupirocin rx given.  The area should gradually get better if area were to expand or worsen then advised ED evaluation.  In that event IV antibiotic or in some cases I&D may be needed.  If area just does not improve at all with nonhealing skin then would recommend dermatology evaluation for biopsy.  Follow-up in 7 to 10 days or as needed.  Esperanza Richters, PA-C

## 2017-09-10 NOTE — Anesthesia Procedure Notes (Signed)
Performed by: Kenyon Eichelberger, CRNA Pre-anesthesia Checklist: Patient identified, Emergency Drugs available, Suction available, Timeout performed and Patient being monitored Patient Re-evaluated:Patient Re-evaluated prior to induction Oxygen Delivery Method: Nasal cannula Placement Confirmation: positive ETCO2       

## 2017-09-10 NOTE — H&P (Signed)
Midge Minium, MD [facility] [address], [address] [place], [place] [address] Phone:[phone] Fax : [phone]  Primary Care Physician:  Sharlene Dory, DO Primary Gastroenterologist:  Dr. Servando Snare  Pre-Procedure History & Physical: HPI:  Timothy Ho is a 52 y.o. male is here for an colonoscopy.   Past Medical History:  Diagnosis Date  . Chronic rhinitis   . H/O: pneumonia 2013    Past Surgical History:  Procedure Laterality Date  . none      Prior to Admission medications   Not on File    Allergies as of 08/12/2017  . (No Known Allergies)    Family History  Problem Relation Age of Onset  . Diabetes Mother   . Crohn's disease Sister   . Lung disease Neg Hx   . Rheumatologic disease Neg Hx   . Cancer Neg Hx     Social History   Socioeconomic History  . Marital status: Married    Spouse name: Not on file  . Number of children: Not on file  . Years of education: Not on file  . Highest education level: Not on file  Occupational History  . Occupation: Stage manager  Social Needs  . Financial resource strain: Not on file  . Food insecurity:    Worry: Not on file    Inability: Not on file  . Transportation needs:    Medical: Not on file    Non-medical: Not on file  Tobacco Use  . Smoking status: Never Smoker  . Smokeless tobacco: Never Used  . Tobacco comment: Father smoked heavily  Substance and Sexual Activity  . Alcohol use: Yes    Alcohol/week: 1.0 - 2.0 standard drinks    Types: 1 - 2 Cans of beer per week    Comment: Beer once or twice a week  . Drug use: No  . Sexual activity: Not on file  Lifestyle  . Physical activity:    Days per week: Not on file    Minutes per session: Not on file  . Stress: Not on file  Relationships  . Social connections:    Talks on phone: Not on file    Gets together: Not on file    Attends religious service: Not on file    Active member of club or organization: Not on file    Attends meetings of clubs or  organizations: Not on file    Relationship status: Not on file  . Intimate partner violence:    Fear of current or ex partner: Not on file    Emotionally abused: Not on file    Physically abused: Not on file    Forced sexual activity: Not on file  Other Topics Concern  . Not on file  Social History Narrative   Originally from [place]. He moved to [facility] in December 2016. 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 Does have a dog. No bird exposure. No mold or hot tub exposure. Questionable asbestos exposure as a child.     Review of Systems: See HPI, otherwise negative ROS  Physical Exam: BP (!) 146/89   Pulse 80   Temp (!) 97.2 F (36.2 C) (Temporal)   Ht 6\' 5"  (1.956 m)   Wt (!) 164.2 kg   SpO2 97%   BMI 42.93 kg/m  General:   Alert,  pleasant and cooperative in NAD Head:  Normocephalic and atraumatic. Neck:  Supple; no masses or thyromegaly. Lungs:  Clear throughout to auscultation.    Heart:  Regular rate and rhythm. Abdomen:  Soft, nontender and nondistended. Normal bowel sounds, without guarding, and without rebound.   Neurologic:  Alert and  oriented x4;  grossly normal neurologically.  Impression/Plan: Timothy Ho is here for an colonoscopy to be performed for history of colon polyps  Risks, benefits, limitations, and alternatives regarding  colonoscopy have been reviewed with the patient.  Questions have been answered.  All parties agreeable.   Midge Minium, MD  09/10/2017, 12:47 PM

## 2017-09-10 NOTE — Anesthesia Postprocedure Evaluation (Signed)
Anesthesia Post Note  Patient: Timothy Ho  Procedure(s) Performed: COLONOSCOPY WITH PROPOFOL (N/A )  Patient location during evaluation: PACU Anesthesia Type: General Level of consciousness: awake and alert Pain management: pain level controlled Vital Signs Assessment: post-procedure vital signs reviewed and stable Respiratory status: spontaneous breathing, nonlabored ventilation, respiratory function stable and patient connected to nasal cannula oxygen Cardiovascular status: blood pressure returned to baseline and stable Postop Assessment: no apparent nausea or vomiting Anesthetic complications: no    Alta Corning

## 2017-09-10 NOTE — Patient Instructions (Addendum)
You do you do appear to have a skin infection.  Bacteria could be normal standard type but even MRSA is possible.  On discussion some speculation of potential tick bite though no portion of tick seen.  Did prescribe doxycycline antibiotic.  Rx advisement given.  Also topical mupirocin rx given.  The area should gradually get better if area were to expand or worsen then advised ED evaluation.  In that event IV antibiotic or in some cases I&D may be needed.  If area just does not improve at all with nonhealing skin then would recommend dermatology evaluation for biopsy.  Follow-up in 7 to 10 days or as needed.

## 2017-09-10 NOTE — Op Note (Signed)
[facility] Gastroenterology Patient Name: Timothy Ho Procedure Date: 09/10/2017 12:44 PM MRN: [id number] Account #: [id number] Date of Birth: [birth year] Admit Type: Outpatient Age: 52 Room: [facility] OR ROOM 01 Gender: Male Note Status: Finalized Procedure:            Colonoscopy Indications:          High risk colon cancer surveillance: Personal history                        of colonic polyps Providers:            Midge Minium MD, MD Referring MD:         Sharlene Dory (Referring MD) Medicines:            Propofol per Anesthesia Complications:        No immediate complications. Procedure:            Pre-Anesthesia Assessment:                       - Prior to the procedure, a History and Physical was                        performed, and patient medications and allergies were                        reviewed. The patient's tolerance of previous                        anesthesia was also reviewed. The risks and benefits of                        the procedure and the sedation options and risks were                        discussed with the patient. All questions were                        answered, and informed consent was obtained. Prior                        Anticoagulants: The patient has taken no previous                        anticoagulant or antiplatelet agents. ASA Grade                        Assessment: II - A patient with mild systemic disease.                        After reviewing the risks and benefits, the patient was                        deemed in satisfactory condition to undergo the                        procedure.                       After obtaining informed consent, the colonoscope was  passed under direct vision. Throughout the procedure,                        the patient's blood pressure, pulse, and oxygen                        saturations were monitored continuously. The was   introduced through the anus and advanced to the the                        cecum, identified by appendiceal orifice and ileocecal                        valve. The colonoscopy was performed without                        difficulty. The patient tolerated the procedure well.                        The quality of the bowel preparation was good. Findings:      The perianal and digital rectal examinations were normal.      Non-bleeding internal hemorrhoids were found during retroflexion. The       hemorrhoids were Grade I (internal hemorrhoids that do not prolapse). Impression:           - Non-bleeding internal hemorrhoids.                       - No specimens collected. Recommendation:       - Discharge patient to home.                       - Resume previous diet.                       - Continue present medications.                       - Repeat colonoscopy in 5 years for surveillance. Procedure Code(s):    --- Professional ---                       [phone], Colonoscopy, flexible; diagnostic, including                        collection of specimen(s) by brushing or washing, when                        performed (separate procedure) Diagnosis Code(s):    --- Professional ---                       Z86.010, Personal history of colonic polyps CPT copyright 2017 American Medical Association. All rights reserved. The codes documented in this report are preliminary and upon coder review may  be revised to meet current compliance requirements. Midge Minium MD, MD 09/10/2017 1:16:06 PM This report has been signed electronically. Number of Addenda: 0 Note Initiated On: 09/10/2017 12:44 PM Scope Withdrawal Time: 0 hours 11 minutes 3 seconds  Total Procedure Duration: 0 hours 17 minutes 42 seconds       [facility]

## 2017-09-10 NOTE — Anesthesia Preprocedure Evaluation (Signed)
Anesthesia Evaluation  Patient identified by MRN, date of birth, ID band Patient awake    Reviewed: Allergy & Precautions, H&P , NPO status , Patient's Chart, lab work & pertinent test results, reviewed documented beta blocker date and time   Airway Mallampati: II  TM Distance: >3 FB Neck ROM: full    Dental no notable dental hx.    Pulmonary neg pulmonary ROS,    Pulmonary exam normal breath sounds clear to auscultation       Cardiovascular Exercise Tolerance: Good negative cardio ROS   Rhythm:regular Rate:Normal     Neuro/Psych negative neurological ROS  negative psych ROS   GI/Hepatic Neg liver ROS, GERD  Controlled,  Endo/Other  negative endocrine ROSMorbid obesity  Renal/GU negative Renal ROS  negative genitourinary   Musculoskeletal   Abdominal   Peds  Hematology negative hematology ROS (+)   Anesthesia Other Findings   Reproductive/Obstetrics negative OB ROS                             Anesthesia Physical Anesthesia Plan  ASA: II  Anesthesia Plan: General   Post-op Pain Management:    Induction:   PONV Risk Score and Plan:   Airway Management Planned:   Additional Equipment:   Intra-op Plan:   Post-operative Plan:   Informed Consent: I have reviewed the patients History and Physical, chart, labs and discussed the procedure including the risks, benefits and alternatives for the proposed anesthesia with the patient or authorized representative who has indicated his/her understanding and acceptance.   Dental Advisory Given  Plan Discussed with: CRNA  Anesthesia Plan Comments:         Anesthesia Quick Evaluation

## 2017-09-10 NOTE — Transfer of Care (Signed)
Immediate Anesthesia Transfer of Care Note  Patient: Timothy Ho  Procedure(s) Performed: COLONOSCOPY WITH PROPOFOL (N/A )  Patient Location: PACU  Anesthesia Type: General  Level of Consciousness: awake, alert  and patient cooperative  Airway and Oxygen Therapy: Patient Spontanous Breathing and Patient connected to supplemental oxygen  Post-op Assessment: Post-op Vital signs reviewed, Patient's Cardiovascular Status Stable, Respiratory Function Stable, Patent Airway and No signs of Nausea or vomiting  Post-op Vital Signs: Reviewed and stable  Complications: No apparent anesthesia complications

## 2017-09-17 ENCOUNTER — Telehealth: Payer: Self-pay | Admitting: Medical

## 2017-09-17 MED ORDER — SULFAMETHOXAZOLE-TRIMETHOPRIM 800-160 MG PO TABS: 1.0000 | ORAL_TABLET | Freq: Two times a day (BID) | ORAL | 0 refills | Status: DC

## 2017-09-17 MED FILL — SULFAMETHOXAZOLE-TMP DS TAB: 800-160 | 3 days supply | Qty: 6 | Fill #0

## 2017-09-17 NOTE — Telephone Encounter (Signed)
Copied from CRM [phone]#[id number]. Topic: Quick Communication - See Telephone Encounter >> Sep 17, 2017  8:32 AM Jolayne Hainesaylor, Brittany L wrote: CRM for notification. See Telephone encounter for: 09/17/17.  Patient called and said he was seen in the office on 9/6 for a spot on his right leg. He saw Ramon DredgeEdward. Ramon Dredgedward told him to call back if it did not look like it was getting any better. He has been taking doxycycline (VIBRA-TABS) 100 MG tablet & mupirocin ointment (BACTROBAN) 2 %. He has took a picture of the spot everyday and it is not impoving. Patient was willing to come back in the office today, no available slots with anyone. Please advise. Can patient have something else be called into the pharmacy?  [facility] [place], [place] - [address] [place] [address]

## 2017-09-17 NOTE — Telephone Encounter (Signed)
Patient scheduled for Monday.

## 2017-09-17 NOTE — Telephone Encounter (Signed)
Notify pt I will send in 3 days of bactrim Ds. Continue doxycycline and mupirocin. If area worsens or changes over weekend then ED evaluation or urgent care. Please get him scheduled with me Monday early afternoon 1:40 pm.

## 2017-09-17 NOTE — Telephone Encounter (Signed)
Patient is unavailable for early afternoon on Monday.  Is it ok if he comes later in the day?

## 2017-09-20 ENCOUNTER — Encounter: Payer: Self-pay | Admitting: Medical

## 2017-09-20 ENCOUNTER — Ambulatory Visit: Payer: 59 | Admitting: Medical

## 2017-09-20 VITALS — BP 138/89 | HR 105 | Temp 99.1°F | Resp 16 | Ht 77.0 in | Wt 373.8 lb

## 2017-09-20 DIAGNOSIS — R234 Changes in skin texture: Secondary | ICD-10-CM | POA: Diagnosis not present

## 2017-09-20 DIAGNOSIS — S81801A Unspecified open wound, right lower leg, initial encounter: Secondary | ICD-10-CM | POA: Diagnosis not present

## 2017-09-20 NOTE — Patient Instructions (Addendum)
For the small nonhealing wound on your right lateral calf area, your PCP evaluated wound today and we did discuss the care of this area.  We recommend  warm salt water soaks to area and attempt to do bride the scab(can mix in small amount of peroxide).  Continue to use mupirocin on topically twice daily.  I think if the scab can be debrided with the salt water soaks then the area could heal by secondary intention.  While you do this I went ahead and put in referral to a dermatologist as well as wound care.  We will see if we can get you in with them sometime later this week or early next week.  Hopefully 1 of these referrals will be available if needed.  Follow-up by call or MyChart this Friday.  Based on how you are doing we will let you know if you need appointment early next week.

## 2017-09-20 NOTE — Progress Notes (Signed)
Subjective:    Patient ID: Timothy NettleJoseph B Fredrick, male    DOB: [birth year], 52 y.o.   MRN: [id number]  HPI  Pt in for follow up.   The area on rt lateral calf is the same. See the history behind this area on the last note. I got a call on Friday with report the area was not improving at all despite us of doxycycline. So I called in bactrim with idea of following up with him today to assess the area. No fever, no chills or sweats.  Pt did not report any known tick bite. But this is a concern of his. Although as states no hx of tick bite.  Pt shows me pictures of area looked like before. Area not improving as he states. But not getting worse either.     Review of Systems  Constitutional: Negative for chills, fatigue and fever.  Respiratory: Negative for cough, chest tightness, shortness of breath and wheezing.   Cardiovascular: Negative for chest pain and palpitations.  Gastrointestinal: Negative for abdominal distention, abdominal pain, blood in stool, constipation and diarrhea.  Skin:       See hpi.  Hematological: Negative for adenopathy. Does not bruise/bleed easily.  Psychiatric/Behavioral: Negative for behavioral problems, decreased concentration, dysphoric mood, self-injury and suicidal ideas. The patient is not nervous/anxious.     Past Medical History:  Diagnosis Date  . Chronic rhinitis   . H/O: pneumonia 2013     Social History   Socioeconomic History  . Marital status: Married    Spouse name: Not on file  . Number of children: Not on file  . Years of education: Not on file  . Highest education level: Not on file  Occupational History  . Occupation: Stage managerT Tech  Social Needs  . Financial resource strain: Not on file  . Food insecurity:    Worry: Not on file    Inability: Not on file  . Transportation needs:    Medical: Not on file    Non-medical: Not on file  Tobacco Use  . Smoking status: Never Smoker  . Smokeless tobacco: Never Used  . Tobacco comment:  Father smoked heavily  Substance and Sexual Activity  . Alcohol use: Yes    Alcohol/week: 1.0 - 2.0 standard drinks    Types: 1 - 2 Cans of beer per week    Comment: Beer once or twice a week  . Drug use: No  . Sexual activity: Not on file  Lifestyle  . Physical activity:    Days per week: Not on file    Minutes per session: Not on file  . Stress: Not on file  Relationships  . Social connections:    Talks on phone: Not on file    Gets together: Not on file    Attends religious service: Not on file    Active member of club or organization: Not on file    Attends meetings of clubs or organizations: Not on file    Relationship status: Not on file  . Intimate partner violence:    Fear of current or ex partner: Not on file    Emotionally abused: Not on file    Physically abused: Not on file    Forced sexual activity: Not on file  Other Topics Concern  . Not on file  Social History Narrative   [place]. 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 Does have a dog. No bird exposure. No mold or hot tub exposure. Questionable asbestos exposure as a child.     Past Surgical History:  Procedure Laterality Date  . COLONOSCOPY WITH PROPOFOL N/A 09/10/2017   Procedure: COLONOSCOPY WITH PROPOFOL;  Surgeon: Midge Minium, MD;  Location: [facility];  Service: Endoscopy;  Laterality: N/A;  . none      Family History  Problem Relation Age of Onset  . Diabetes Mother   . Crohn's disease Sister   . Lung disease Neg Hx   . Rheumatologic disease Neg Hx   . Cancer Neg Hx     No Known Allergies  Current Outpatient Medications on File Prior to Visit  Medication Sig Dispense Refill  . doxycycline (VIBRA-TABS) 100 MG tablet Take 1 tablet (100 mg total) by mouth 2 (two) times daily. 20 tablet 0  . mupirocin ointment (BACTROBAN) 2 % Apply thin film twice daily 22 g 0  .  sulfamethoxazole-trimethoprim (BACTRIM DS,SEPTRA DS) 800-160 MG tablet Take 1 tablet by mouth 2 (two) times daily. 6 tablet 0   No current facility-administered medications on file prior to visit.     BP 138/89   Pulse (!) 105   Temp 99.1 F (37.3 C) (Oral)   Resp 16   Ht 6\' 5"  (1.956 m)   Wt (!) 373 lb 12.8 oz (169.6 kg)   SpO2 98%   BMI 44.33 kg/m       Objective:   Physical Exam  General- No acute distress. Pleasant patient. Neck- Full range of motion, no jvd Lungs- Clear, even and unlabored. Heart- regular rate and rhythm. Neurologic- CNII- XII grossly intact.  Skin- lateral mid aspect of calf. 6 mm scab in middle slight redness around edge. No tick seen. No dc from middle. Area feels mild indurated and tender.(very similar to prior presentation)      Assessment & Plan:  For the small nonhealing wound on your right lateral calf area, your PCP evaluated wound today and we did discuss the care of this area.  We recommend  warm salt water soaks to area and attempt to do bride the scab(can mix in small amount of peroxide).  Continue to use mupirocin on topically twice daily.  I think if the scab can be debrided with the salt water soaks then the area could heal by secondary intention.  While you do this I went ahead and put in referral to a dermatologist as well as wound care.  We will see if we can get you in with them sometime later this week or early next week.  Hopefully 1 of these referrals will be available if needed.  Follow-up by call or MyChart this Friday.  Based on how you are doing we will let you know if you need appointment early next week.  Dr. Carmelia Roller did caution him to notify us of worsening infectious signs and symtpom to watch out for.   No new oral antibiotic written but did explain to him can use remaining doxycline tabs he ha.

## 2017-09-22 ENCOUNTER — Telehealth: Payer: Self-pay | Admitting: Medical

## 2017-09-22 ENCOUNTER — Encounter: Payer: Self-pay | Admitting: Neurology

## 2017-09-22 ENCOUNTER — Ambulatory Visit (INDEPENDENT_AMBULATORY_CARE_PROVIDER_SITE_OTHER): Payer: 59 | Admitting: Neurology

## 2017-09-22 VITALS — BP 141/84 | HR 76 | Ht 77.0 in | Wt 373.0 lb

## 2017-09-22 DIAGNOSIS — R5383 Other fatigue: Secondary | ICD-10-CM | POA: Insufficient documentation

## 2017-09-22 DIAGNOSIS — G4733 Obstructive sleep apnea (adult) (pediatric): Secondary | ICD-10-CM

## 2017-09-22 DIAGNOSIS — Z6841 Body Mass Index (BMI) 40.0 and over, adult: Secondary | ICD-10-CM

## 2017-09-22 DIAGNOSIS — G4719 Other hypersomnia: Secondary | ICD-10-CM | POA: Insufficient documentation

## 2017-09-22 NOTE — Telephone Encounter (Signed)
Will you see his referral request to either derm or wound care. You informed Pryor CuriaGwenn was out so can you check and see if derm or wound care can see him by Friday or maybe early next week Monday or Tuesday.

## 2017-09-22 NOTE — Progress Notes (Signed)
SLEEP MEDICINE CLINIC   Provider:  Melvyn Novas, M D  Primary Care Physician:  Sharlene Dory, DO   Referring Provider: Sharlene Dory*   Chief Complaint  Patient presents with  . New Patient (Initial Visit)    pt with wife ( a BiPAP user) , rm 10. pt states that he snores and he is concerned about sleep apnea. during last week's  colonoscopy procedure he was made aware he stopped breathing- and also at home with his family they have witnessed the apnea events. never had a sleep study before    HPI:  Timothy Ho is a 52 y.o. male patient seen on 09-22-2017 seen here as in a referral from Dr. Carmelia Roller for a sleep evaluation.  Chief complaint according to patient : Son noticed I have the pleasure meeting Mr. Timothy Ho today, who brought his spouse -another patient of mine- today to the meeting.  His main concern has been that family members noticed him snoring,  but just last week he underwent a colonoscopy and was made aware by the nurse that he snored and paused in his breathing.   Sleep habits are as follows:  He introduced exercises on week ends and feels almost a high after it. He feels wound up but is able to sleep about 2 hours later. Watches Tv , goes to bed by 11.30- and asleep by midnight. Some nights he uses Zyquil. The bedroom is cool, quiet and dark.  He will sleep for 6 hours, rarely has nocturia. He sleeps promptly and his wife noted crescendo breathing. He sleeps on his left side. He uses 2 pillows for head support, flat bed. He may have one bathroom break at night but usually has no difficulties to go back to sleep.  Once he wakes up in the morning at about 6 AM he is not refreshed and restored and feels that he would crave more sleep.  Sleep medical history and family sleep history: Brother Genevie Cheshire has apnea. Brother Reita Cliche has insomnia.   Social history: married, Psychiatric nurse at [employer], 2 children - 2 and 5 years old.  Non smoker, ETOH-  socially 1-2/ week. Caffeine: 2-4 cups in AM of coffee, none after lunch. No iced tea , but soda at dinner. .   Review of Systems: Out of a complete 14 system review, the patient complains of only the following symptoms, and all other reviewed systems are negative. Snoring, witnessed apneas, no refreshing sleep.   Epworth score 14/ 24  , Fatigue severity score 62/ 63  , depression score n/a    Social History   Socioeconomic History  . Marital status: Married    Spouse name: Not on file  . Number of children: Not on file  . Years of education: Not on file  . Highest education level: Not on file  Occupational History  . Occupation: Stage manager  Social Needs  . Financial resource strain: Not on file  . Food insecurity:    Worry: Not on file    Inability: Not on file  . Transportation needs:    Medical: Not on file    Non-medical: Not on file  Tobacco Use  . Smoking status: Never Smoker  . Smokeless tobacco: Never Used  . Tobacco comment: Father smoked heavily  Substance and Sexual Activity  . Alcohol use: Yes    Alcohol/week: 1.0 - 2.0 standard drinks    Types: 1 - 2 Cans of beer per week    Comment:  Beer once or twice a week  . Drug use: No  . Sexual activity: Not on file  Lifestyle  . Physical activity:    Days per week: Not on file    Minutes per session: Not on file  . Stress: Not on file  Relationships  . Social connections:    Talks on phone: Not on file    Gets together: Not on file    Attends religious service: Not on file    Active member of club or organization: Not on file    Attends meetings of clubs or organizations: Not on file    Relationship status: Not on file  . Intimate partner violence:    Fear of current or ex partner: Not on file    Emotionally abused: Not on file    Physically abused: Not on file    Forced sexual activity: Not on file  Other Topics Concern  . Not on file  Social History Narrative   Originally from [place]. He moved to [facility] in  December 2016. 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 Does have a dog. No bird exposure. No mold or hot tub exposure. Questionable asbestos exposure as a child.     Family History  Problem Relation Age of Onset  . Diabetes Mother   . Crohn's disease Sister   . Lung disease Neg Hx   . Rheumatologic disease Neg Hx   . Cancer Neg Hx     Past Medical History:  Diagnosis Date  . Chronic rhinitis   . H/O: pneumonia 2013    Past Surgical History:  Procedure Laterality Date  . COLONOSCOPY WITH PROPOFOL N/A 09/10/2017   Procedure: COLONOSCOPY WITH PROPOFOL;  Surgeon: Midge Minium, MD;  Location: [facility];  Service: Endoscopy;  Laterality: N/A;  . none      Current Outpatient Medications  Medication Sig Dispense Refill  . doxycycline (VIBRA-TABS) 100 MG tablet Take 1 tablet (100 mg total) by mouth 2 (two) times daily. 20 tablet 0  . mupirocin ointment (BACTROBAN) 2 % Apply thin film twice daily 22 g 0   No current facility-administered medications for this visit.     Allergies as of 09/22/2017  . (No Known Allergies)    Vitals: BP (!) 141/84   Pulse 76   Ht 6\' 5"  (1.956 m)   Wt (!) 373 lb (169.2 kg)   BMI 44.23 kg/m  Last Weight:  Wt Readings from Last 1 Encounters:  09/22/17 (!) 373 lb (169.2 kg)   ZOX:WRUE mass index is 44.23 kg/m.     Last Height:   Ht Readings from Last 1 Encounters:  09/22/17 6\' 5"  (1.956 m)    Physical exam:  General: The patient is awake, alert and appears not in acute distress. The patient is well groomed. He gained weight since he moved to the [place], job related.  Head: Normocephalic, atraumatic. Neck is supple. Mallampati 4,  neck circumference:21". Nasal airflow congested - Retrognathia is not seen.  Cardiovascular:  Regular rate and rhythm , without  murmurs or carotid bruit, and without distended neck veins. Respiratory: Lungs are clear to auscultation. Skin:   Without evidence of edema, or rash Trunk: BMI is 44.23 . The patient's posture is erect  Neurologic exam : The patient is awake and alert, oriented to place and time.   Memory subjective  described as intact.   Attention span & concentration  ability appears normal.  Speech is fluent,  Without  dysarthria, dysphonia or aphasia.  Mood and affect are appropriate.  Cranial nerves: Pupils are equal and briskly reactive to light.E xtraocular movements  in vertical and horizontal planes intact and without nystagmus. Visual fields by finger perimetry are intact.Hearing to finger rub intact.  Facial sensation intact to fine touch. Facial motor strength is symmetric and tongue and uvula move midline. Shoulder shrug was symmetrical.   Motor exam:   Normal tone, muscle bulk and symmetric strength in all extremities. Sensory:  Fine touch, pinprick and vibration were tested in all extremities. Proprioception tested in the upper extremities was normal. Coordination: Finger-to-nose maneuver normal without evidence of ataxia, dysmetria or tremor. Gait and station: Patient walks without assistive device . Strength within normal limits. Stance is stable and normal.  Turns with  3 Steps. Romberg testing is negative. Deep tendon reflexes: in the  upper and lower extremities are symmetric and intact. Babinski maneuver response is downgoing.    Assessment:  After physical and neurologic examination, review of laboratory studies,  Personal review of imaging studies, reports of other /same  Imaging studies, results of polysomnography and / or neurophysiology testing and pre-existing records as far as provided in visit., my assessment is   1) He has apnea- it was witnessed and he has been snoring for much of his adult life. He is extremely fatigued and excessively daytime sleepy.  He needs a proper diagnosis and treatment.  He has neither DM, no HTN, and neither has he had any rheumatological disorders.   2).  Also  main risk factor will be the elevated body mass index, and certainly his sleep quality has also decreased as his job demands have increased.  So there is a stress component.  Stress is always the enemy of sleep.     The patient was advised of the nature of the diagnosed disorder , the treatment options and the  risks for general health and wellness arising from not treating the condition.   I spent more than 40  minutes of face to face time with the patient.  Greater than 50% of time was spent in counseling and coordination of care. We have discussed the diagnosis and differential and I answered the patient's questions.    Plan:  Treatment plan and additional workup :  I would like for Timothy Ho to have an attended sleep study as soon as possible, we can offer him a choice between 6-week nights except Saturdays.  He will have to arrive at either 8 or 9 PM, sleep studies and between 5 and 6 AM.  My goal is to run a split-night polysomnography so that he will leave here knowing that he will have a CPAP machine available as soon as possible, and the settings should be determined by the results of the study.  We will meet after the sleep study is completed to discuss the results, but treatment may be initiated in the interval.   Melvyn NovasARMEN Zuriel Roskos, MD 09/22/2017, 11:30 AM  Certified in Neurology by ABPN Certified in Sleep Medicine by [facility]  [facility] [address], [address] [place], [place] [address]

## 2017-09-22 NOTE — Telephone Encounter (Signed)
Copied from CRM [id number]. Topic: Referral - Question >> Sep 22, 2017  1:23 PM Burchel, Abbi R wrote: Reason for CRM:   Marchelle FolksAmanda [facility]) has some questions about the referral to wound care and dermatology.  Please call to advise.   [phone]

## 2017-09-22 NOTE — Addendum Note (Signed)
Addended by: Melvyn NovasHMEIER, Kishaun Erekson on: 09/22/2017 12:14 PM   Modules accepted: Orders

## 2017-09-22 NOTE — Patient Instructions (Signed)

## 2017-09-23 ENCOUNTER — Telehealth: Payer: Self-pay | Admitting: Medical

## 2017-09-23 NOTE — Telephone Encounter (Signed)
Was wondering would you call wound care and see if they can see pt by early next week. Derm is questioning referral. In my opinion pt problems falls under the realm of both specialties.

## 2017-09-23 NOTE — Telephone Encounter (Signed)
I thought either would be appropiate. Just did not want to wait extended period of time for appointment.  This area is small and could be characterized skin lesion. It is not responding to antibiotics. Thus, I thought derm might be appropiate.

## 2017-09-23 NOTE — Telephone Encounter (Signed)
Timothy Ho wants to be sure the referral is for dermatology and not wound because pt has a wound that's not healing. Please advise.

## 2017-09-24 ENCOUNTER — Telehealth: Payer: Self-pay | Admitting: Medical

## 2017-09-24 NOTE — Telephone Encounter (Signed)
Patient is scheduled with Wound care on 10/06/17

## 2017-09-24 NOTE — Telephone Encounter (Signed)
Is he aware of appointment date?

## 2017-09-24 NOTE — Telephone Encounter (Signed)
Left Marchelle FolksAmanda at [facility] a message that PCP felt Derm or wound care would be appropriate. Notified her to call back if she has any questions

## 2017-09-24 NOTE — Telephone Encounter (Signed)
Opened to review 

## 2017-10-04 DIAGNOSIS — H1859 Other hereditary corneal dystrophies: Secondary | ICD-10-CM | POA: Diagnosis not present

## 2017-10-04 DIAGNOSIS — H04123 Dry eye syndrome of bilateral lacrimal glands: Secondary | ICD-10-CM | POA: Diagnosis not present

## 2017-10-04 DIAGNOSIS — H40013 Open angle with borderline findings, low risk, bilateral: Secondary | ICD-10-CM | POA: Diagnosis not present

## 2017-10-06 ENCOUNTER — Ambulatory Visit (INDEPENDENT_AMBULATORY_CARE_PROVIDER_SITE_OTHER): Payer: 59 | Admitting: Neurology

## 2017-10-06 ENCOUNTER — Encounter (HOSPITAL_BASED_OUTPATIENT_CLINIC_OR_DEPARTMENT_OTHER): Payer: 59

## 2017-10-06 DIAGNOSIS — G4733 Obstructive sleep apnea (adult) (pediatric): Secondary | ICD-10-CM

## 2017-10-06 DIAGNOSIS — G4737 Central sleep apnea in conditions classified elsewhere: Secondary | ICD-10-CM

## 2017-10-06 DIAGNOSIS — G4731 Primary central sleep apnea: Secondary | ICD-10-CM

## 2017-10-06 DIAGNOSIS — R5383 Other fatigue: Secondary | ICD-10-CM

## 2017-10-06 DIAGNOSIS — G4719 Other hypersomnia: Secondary | ICD-10-CM

## 2017-10-06 DIAGNOSIS — Z6841 Body Mass Index (BMI) 40.0 and over, adult: Secondary | ICD-10-CM

## 2017-10-08 DIAGNOSIS — G4731 Primary central sleep apnea: Secondary | ICD-10-CM | POA: Insufficient documentation

## 2017-10-08 DIAGNOSIS — G4737 Central sleep apnea in conditions classified elsewhere: Secondary | ICD-10-CM | POA: Insufficient documentation

## 2017-10-08 NOTE — Procedures (Signed)
PATIENT'S NAME:  Timothy Ho, Timothy Ho DOB:      [birth year]      MR#:    [id number]     DATE OF RECORDING: 10/06/2017 REFERRING M.D.:  Sharlene Dory, D.O. Study Performed:  Split-Night Titration Study HISTORY:  Timothy Ho is a 52 y.o. male patient seen on 09-22-2017 in a referral from Dr. Carmelia Roller for a sleep evaluation. Patient states that he snores and he is excessively sleepy and fatigued, concerned about sleep apnea. During or after his last colonoscopy procedure he was made aware he stopped breathing- and his family has witnessed the apnea events as well. He had no previous sleep study.  The patient endorsed the Epworth Sleepiness Scale at 14/24 points, FSS at 62/63 points. Severe fatigue.    The patient's weight 373 pounds with a height of 77 (inches), resulting in a BMI of 44.0 kg/m2. The patient's neck circumference measured 21 inches.  CURRENT MEDICATIONS: Vibra-Tabs, Bactroban PROCEDURE:  This is a multichannel digital polysomnogram utilizing the Somnostar 11.2 system.  Electrodes and sensors were applied and monitored per AASM Specifications.   EEG, EOG, Chin and Limb EMG, were sampled at 200 Hz.  ECG, Snore and Nasal Pressure, Thermal Airflow, Respiratory Effort, CPAP Flow and Pressure, Oximetry was sampled at 50 Hz. Digital video and audio were recorded.      BASELINE STUDY WITHOUT CPAP RESULTS: Lights Out was at 22:04 and Lights On at 04:59.  Total recording time (TRT) was 166, with a total sleep time (TST) of 128 minutes.   The patient's sleep latency was 17 minutes.  REM latency was 0 minutes.  The sleep efficiency was 77.1 %.    SLEEP ARCHITECTURE: WASO (Wake after sleep onset) was 12.5 minutes, Stage N1 was 4.5 minutes, Stage N2 was 85 minutes, Stage N3 was 38.5 minutes and Stage R (REM sleep) was 0 minutes.  The percentages were Stage N1 3.5%, Stage N2 66.4%, Stage N3 30.1% and Stage R (REM sleep) 0%.   RESPIRATORY ANALYSIS:  There were a total of 218 respiratory  events:  18 obstructive apneas, 125 central apneas and 75 hypopneas. The total APNEA/HYPOPNEA INDEX (AHI) was 102.2 /hour.  0 events occurred in REM sleep and 275 events in NREM with a non-REM AHI of 102.2 /hour. The patient spent 51.5 minutes sleep time in the supine position 295 minutes in non-supine. The supine AHI was 0.0 /hour versus a non-supine AHI of 102.2 /hour.  OXYGEN SATURATION & C02:  The wake baseline 02 saturation was 92%, with the lowest being 85%. Time spent below 89% saturation equaled 20 minutes. PERIODIC LIMB MOVEMENTS: The patient had a total of 0 Periodic Limb Movements.  The arousals were noted as: 14 were spontaneous, 0 were associated with PLMs, and 124 were associated with respiratory events. Audio and video analysis did not show any abnormal or unusual movements, behaviors, phonations or vocalizations. The patient took a bathroom break before CPAP titration. Snoring was noted. The EKG was in keeping with normal sinus rhythm (NSR)  TITRATION STUDY WITH CPAP RESULTS:   CPAP was initiated at 5 cmH20 without sleep onset and immediately increased to 7 cm with heated humidity per AASM split night standards and pressure was further advanced to 11 cmH20 because of central apneas and desaturations.  At a PAP pressure of 7 cmH20, there was a reduction of the AHI to 3.5 /hour.  Higher pressures increased central apneas.   Total recording time (TRT) was 249 minutes, with a total sleep time (  TST) of 218 minutes. The patient's sleep latency was 10.5 minutes. REM latency was 38.5 minutes.  The sleep efficiency was 87.6 %.    SLEEP ARCHITECTURE: Wake after sleep was 22 minutes, Stage N1 7 minutes, Stage N2 106.5 minutes, Stage N3 41.5 minutes and Stage R (REM sleep) 63 minutes. The percentages were: Stage N1 3.2%, Stage N2 48.9%, Stage N3 19.% and Stage R (REM sleep) 28.9%. The sleep architecture was notable for REM rebound.  RESPIRATORY ANALYSIS:  There were a total of 29 respiratory  events: 0 obstructive apneas, 22 central apneas and 0 mixed apneas with 7 hypopneas  and 0 respiratory event related arousals (RERAs).    The total APNEA/HYPOPNEA INDEX (AHI) was 8.0 /hour.  2 events occurred in REM sleep and 27 events in NREM. The REM AHI was 1.9 /hour versus a non-REM AHI of 10.5 /hour. REM sleep was achieved on a pressure of 7 cm water. The patient spent 24% of total sleep time in the supine position. The supine AHI was 16.4 /hour, versus a non-supine AHI of 5.4/hour.  OXYGEN SATURATION & C02:  The wake baseline 02 saturation was 97%, with the lowest being 89%. Time spent below 89% saturation equaled 0 minutes. PERIODIC LIMB MOVEMENTS:   The patient had a total of 0 Periodic Limb Movements. The arousals were noted as: 20 were spontaneous, 0 were associated with PLMs, and 17 were associated with respiratory events. Post-study, the patient indicated that sleep was less sound than usual.  POLYSOMNOGRAPHY IMPRESSION : Severe Central Sleep Apnea, with more Sleep Apnea Events emerging under CPAP pressures higher than 7 cm water.   RECOMMENDATIONS: The patient did only well while treated at 7 cm water CPAP, which also allowed REM rebound.  The patient should avoid the supine sleep position.  Central sleep apnea should not be treated by auto-CPAP. I will order a narrow setting from 4-8 cm water and the large ESON nasal mask. I will also request a download within the first 2-3 weeks of CPAP use, to review for further central apneas arising.    A follow up appointment will be scheduled in the Sleep Clinic at [facility].    I certify that I have reviewed the entire raw data recording prior to the issuance of this report in accordance with the Standards of Accreditation of the American Academy of Sleep Medicine (AASM)  Melvyn Novas, M.D.  10-08-2017  Diplomat, American Board of Psychiatry and Neurology  Diplomat, American Board of Sleep Medicine Medical Director,  [facility]

## 2017-10-08 NOTE — Addendum Note (Signed)
Addended by: Melvyn Novas on: 10/08/2017 02:46 PM   Modules accepted: Orders

## 2017-10-12 ENCOUNTER — Telehealth: Payer: Self-pay | Admitting: Neurology

## 2017-10-12 NOTE — Telephone Encounter (Signed)
-----   Message from Melvyn Novas, MD sent at 10/08/2017  2:46 PM EDT ----- POLYSOMNOGRAPHY IMPRESSION : Severe Central Sleep Apnea, with  more Sleep Apnea Events emerging under CPAP pressures higher than  7 cm water.   RECOMMENDATIONS: The patient did only well while treated at 7 cm  water CPAP, which also allowed REM rebound.  The patient should avoid the supine sleep position.  Central sleep apnea should not be treated by auto-CPAP. I will  order a narrow setting from 4-8 cm water and the large ESON nasal  mask. I will also request a download within the first 2-3 weeks  of CPAP use, to review for further central apneas arising.   A follow up appointment will be scheduled in the Sleep Clinic at  [facility].

## 2017-10-12 NOTE — Telephone Encounter (Signed)
I called pt. I advised pt that Dr. Vickey Huger reviewed their sleep study results and found that pt had complex apnea mostly central apnea. Dr. Vickey Huger recommends that pt initially starts a auto CPAP and set at a very narrow range of 4-8 cm of water pressure. In the study we were able to observe that the CPAP pressure of 7 was able to reduce his apnea to 3.5/hr. I reviewed PAP compliance expectations with the pt. Pt is agreeable to starting a CPAP. I advised pt that an order will be sent to a DME, [facility], and [facility] will call the pt within about one week after they file with the pt's insurance. [facility] will show the pt how to use the machine, fit for masks, and troubleshoot the CPAP if needed. A follow up appt was made for insurance purposes with Butch Penny, NP on Jan 2,2020 at 10:00 am. Pt verbalized understanding to arrive 15 minutes early and bring their CPAP. A letter with all of this information in it will be mailed to the pt as a reminder. I verified with the pt that the address we have on file is correct. Pt verbalized understanding of results. Pt had no questions at this time but was encouraged to call back if questions arise.

## 2017-10-22 DIAGNOSIS — G4733 Obstructive sleep apnea (adult) (pediatric): Secondary | ICD-10-CM | POA: Diagnosis not present

## 2017-11-22 DIAGNOSIS — G4733 Obstructive sleep apnea (adult) (pediatric): Secondary | ICD-10-CM | POA: Diagnosis not present

## 2017-12-17 DIAGNOSIS — H524 Presbyopia: Secondary | ICD-10-CM | POA: Diagnosis not present

## 2017-12-22 ENCOUNTER — Encounter (INDEPENDENT_AMBULATORY_CARE_PROVIDER_SITE_OTHER): Payer: 59

## 2017-12-22 DIAGNOSIS — G4733 Obstructive sleep apnea (adult) (pediatric): Secondary | ICD-10-CM | POA: Diagnosis not present

## 2018-01-02 ENCOUNTER — Encounter: Payer: Self-pay | Admitting: Adult Health

## 2018-01-06 ENCOUNTER — Ambulatory Visit: Payer: 59 | Admitting: Adult Health

## 2018-01-06 ENCOUNTER — Encounter: Payer: Self-pay | Admitting: Adult Health

## 2018-01-06 VITALS — BP 146/77 | HR 74 | Ht 77.0 in | Wt 382.0 lb

## 2018-01-06 DIAGNOSIS — G4731 Primary central sleep apnea: Secondary | ICD-10-CM

## 2018-01-06 NOTE — Progress Notes (Signed)
PATIENT: Timothy Ho DOB: 1965/02/02  REASON FOR VISIT: follow up HISTORY FROM: patient  HISTORY OF PRESENT ILLNESS: Today 01/06/18:  Timothy Ho presents today for Cpap follow up. He is compliant with CPAP usage as download shows usage of 30/30 days for 100% compliance. He used his machine for > 4 hours 30/30 days for 100% compliance. On average he uses his CPAP 8 hours and 2 minutes per day. His residual AHI is 1.3  on 4-8 cmH2O with EPR of 3. There is no leak. He reports feeling less fatigued in the mornings. He is here today for an evaluation.   HISTORY (Copied from Dr.Dohmeier's note)Timothy Ho is a 53 y.o. male patient seen on 09-22-2017 seen here as in a referral from Dr. Carmelia RollerWendling for a sleep evaluation.  Chief complaint according to patient : Son noticed I have the pleasure meeting Timothy. Timothy Ho today, who brought his spouse -another patient of mine- today to the meeting.  His main concern has been that family members noticed him snoring,  but just last week he underwent a colonoscopy and was made aware by the nurse that he snored and paused in his breathing.   Sleep habits are as follows:  He introduced exercises on week ends and feels almost a high after it. He feels wound up but is able to sleep about 2 hours later. Watches Tv , goes to bed by 11.30- and asleep by midnight. Some nights he uses Zyquil. The bedroom is cool, quiet and dark.  He will sleep for 6 hours, rarely has nocturia. He sleeps promptly and his wife noted crescendo breathing. He sleeps on his left side. He uses 2 pillows for head support, flat bed. He may have one bathroom break at night but usually has no difficulties to go back to sleep.  Once he wakes up in the morning at about 6 AM he is not refreshed and restored and feels that he would crave more sleep.  Sleep medical history and family sleep history: Brother Timothy Ho has apnea. Brother Timothy Ho has insomnia.   Social history: married, Health and safety inspectorT  manager at [employer], 2 children - 9520 and 55 years old.  Non smoker, ETOH- socially 1-2/ week. Caffeine: 2-4 cups in AM of coffee, none after lunch. No iced tea , but soda at dinner. Marland Kitchen.    REVIEW OF SYSTEMS: Out of a complete 14 system review of symptoms, the patient complains only of the following symptoms, and all other reviewed systems are negative.  Epworth Sleepiness Scale: 4  ALLERGIES: No Known Allergies  HOME MEDICATIONS: Outpatient Medications Prior to Visit  Medication Sig Dispense Refill  . mupirocin ointment (BACTROBAN) 2 % Apply thin film twice daily (Patient not taking: Reported on 01/06/2018) 22 g 0  . doxycycline (VIBRA-TABS) 100 MG tablet Take 1 tablet (100 mg total) by mouth 2 (two) times daily. 20 tablet 0   No facility-administered medications prior to visit.     PAST MEDICAL HISTORY: Past Medical History:  Diagnosis Date  . Chronic rhinitis   . H/O: pneumonia 2013    PAST SURGICAL HISTORY: Past Surgical History:  Procedure Laterality Date  . COLONOSCOPY WITH PROPOFOL N/A 09/10/2017   Procedure: COLONOSCOPY WITH PROPOFOL;  Surgeon: Midge MiniumWohl, Darren, MD;  [facility];  Service: Endoscopy;  Laterality: N/A;  . none      FAMILY HISTORY: Family History  Problem Relation Age of Onset  . Diabetes Mother   . Crohn's disease Sister   .  Lung disease Neg Hx   . Rheumatologic disease Neg Hx   . Cancer Neg Hx     SOCIAL HISTORY: Social History   Socioeconomic History  . Marital status: Married    Spouse name: Not on file  . Number of children: Not on file  . Years of education: Not on file  . Highest education level: Not on file  Occupational History  . Occupation: Stage manager  Social Needs  . Financial resource strain: Not on file  . Food insecurity:    Worry: Not on file    Inability: Not on file  . Transportation needs:    Medical: Not on file    Non-medical: Not on file  Tobacco Use  . Smoking status: Never Smoker  . Smokeless tobacco:  Never Used  . Tobacco comment: Father smoked heavily  Substance and Sexual Activity  . Alcohol use: Yes    Alcohol/week: 1.0 - 2.0 standard drinks    Types: 1 - 2 Cans of beer per week    Comment: Beer once or twice a week  . Drug use: No  . Sexual activity: Not on file  Lifestyle  . Physical activity:    Days per week: Not on file    Minutes per session: Not on file  . Stress: Not on file  Relationships  . Social connections:    Talks on phone: Not on file    Gets together: Not on file    Attends religious service: Not on file    Active member of club or organization: Not on file    Attends meetings of clubs or organizations: Not on file    Relationship status: Not on file  . Intimate partner violence:    Fear of current or ex partner: Not on file    Emotionally abused: Not on file    Physically abused: Not on file    Forced sexual activity: Not on file  Other Topics Concern  . Not on file  Social History Narrative   Originally from [place]. He moved to [facility] in December 2016. 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 Does have a dog. No bird exposure. No mold or hot tub exposure. Questionable asbestos exposure as a child.       PHYSICAL EXAM  Vitals:   01/06/18 0942  BP: (!) 146/77  Pulse: 74  Weight: (!) 382 lb (173.3 kg)  Height: 6\' 5"  (1.956 m)   Body mass index is 45.3 kg/m.  Generalized: Well developed, in no acute distress  Respiratory: Lungs clear in all posterior lobes  Neurological examination  Mentation: Alert oriented to time, place, history taking. Follows all commands speech and language fluent Cranial nerve II-XII: Pupils were equal round reactive to light. Extraocular movements were full. Facial sensation and strength were normal. Uvula tongue midline. Neck circumference 18.5. Mallampati 3.   Motor: The motor testing reveals 5 over 5 strength of all 4 extremities. Good symmetric motor tone is  noted throughout.  Sensory: Sensory testing is intact to soft touch on all 4 extremities. .  Gait and station: Gait is normal.     DIAGNOSTIC DATA (LABS, IMAGING, TESTING) - I reviewed patient records, labs, notes, testing and imaging myself where available.  Lab Results  Component Value Date   WBC 6.7 10/11/2015   HGB 15.0 10/11/2015   HCT 45.2 10/11/2015   MCV 87.0 10/11/2015   PLT  238.0 10/11/2015      Component Value Date/Time   NA 142 06/10/2017 1547   K 4.1 06/10/2017 1547   CL 105 06/10/2017 1547   CO2 28 06/10/2017 1547   GLUCOSE 94 06/10/2017 1547   BUN 14 06/10/2017 1547   CREATININE 0.90 06/10/2017 1547   CALCIUM 9.5 06/10/2017 1547   PROT 7.0 06/10/2017 1547   ALBUMIN 4.3 06/10/2017 1547   AST 24 06/10/2017 1547   ALT 32 06/10/2017 1547   ALKPHOS 85 06/10/2017 1547   BILITOT 0.5 06/10/2017 1547   Lab Results  Component Value Date   CHOL 194 06/10/2017   HDL 34.90 (L) 06/10/2017   LDLCALC 128 (H) 06/10/2017   TRIG 156.0 (H) 06/10/2017   CHOLHDL 6 06/10/2017      ASSESSMENT AND PLAN 53 y.o. year old male  has a past medical history of Chronic rhinitis and H/O: pneumonia (2013). He is here for:  1. Complex Sleep Apnea Syndrome   His download shows optimum compliance. He reports feeling less tired during the day. He was encouraged to continue using CPAP every night and greater than 4 hours each night. We will follow up in one year. Patient was advised to call sooner if needed.    I spent 15 minutes with the patient. 50% of this time was spent reviewing the patient CPAP download   Shawnie Dappermy Shalom Mcguiness, FNP-C 01/06/2018, 11:25 AM [facility] [address], [address] [place], [place] [address] [phone]   I went in after Dardan Shelton-reviewed the patient's download with him and completed physical assessment.  I agree with the above documentation.  Butch PennyMegan Millikan, MSN, NP-C 01/06/2018, 1:00 PM [facility] [address], [address] [place], [place] [address] [phone]

## 2018-01-06 NOTE — Patient Instructions (Signed)
Download shows 100% compliance. Continue using CPAP nightly and greater than 4 hours each night. We will see you back in 1 year, sooner if needed.

## 2018-01-11 ENCOUNTER — Encounter (INDEPENDENT_AMBULATORY_CARE_PROVIDER_SITE_OTHER): Payer: Self-pay | Admitting: Family Medicine

## 2018-01-11 ENCOUNTER — Ambulatory Visit (INDEPENDENT_AMBULATORY_CARE_PROVIDER_SITE_OTHER): Payer: 59 | Admitting: Family Medicine

## 2018-01-11 VITALS — BP 143/79 | HR 78 | Temp 98.0°F | Ht 76.0 in | Wt 372.0 lb

## 2018-01-11 DIAGNOSIS — I10 Essential (primary) hypertension: Secondary | ICD-10-CM | POA: Diagnosis not present

## 2018-01-11 DIAGNOSIS — R0602 Shortness of breath: Secondary | ICD-10-CM

## 2018-01-11 DIAGNOSIS — Z1331 Encounter for screening for depression: Secondary | ICD-10-CM | POA: Diagnosis not present

## 2018-01-11 DIAGNOSIS — Z9189 Other specified personal risk factors, not elsewhere classified: Secondary | ICD-10-CM | POA: Diagnosis not present

## 2018-01-11 DIAGNOSIS — E7849 Other hyperlipidemia: Secondary | ICD-10-CM

## 2018-01-11 DIAGNOSIS — Z0289 Encounter for other administrative examinations: Secondary | ICD-10-CM

## 2018-01-11 DIAGNOSIS — R5383 Other fatigue: Secondary | ICD-10-CM

## 2018-01-11 DIAGNOSIS — Z6841 Body Mass Index (BMI) 40.0 and over, adult: Secondary | ICD-10-CM | POA: Diagnosis not present

## 2018-01-12 NOTE — Progress Notes (Signed)
Office: [phone]  /  Fax: [phone]   Dear Dr. Vickey Ho,   Thank you for referring Timothy Ho to our clinic. The following note includes my evaluation and treatment recommendations.  HPI:   Chief Complaint: OBESITY    Timothy Ho has been referred by Timothy Novas, MD for consultation regarding his obesity and obesity related comorbidities.    Timothy Ho (MR# [id number]) is a 53 y.o. male who presents on 01/11/2018 for obesity evaluation and treatment. Current BMI is Body mass index is 45.28 kg/m.Timothy Ho has been struggling with his weight for many years and has been unsuccessful in either losing weight, maintaining weight loss, or reaching his healthy weight goal.     Timothy Ho attended our information session and states he is currently in the action stage of change and ready to dedicate time achieving and maintaining a healthier weight. Timothy Ho is interested in becoming our patient and working on intensive lifestyle modifications including (but not limited to) diet, exercise and weight loss.    Timothy Ho states his family eats meals together he thinks his family will eat healthier with  him his desired weight loss is 92 lbs he has been heavy most of  his life he started gaining weight in the last 20 years his heaviest weight ever was 377 lbs he has significant food cravings issues  he snacks frequently in the evenings he is frequently drinking liquids with calories he frequently eats larger portions than normal  he struggles with emotional eating    Fatigue Timothy Ho feels his energy is lower than it should be. This has worsened with weight gain and has not worsened recently. Timothy Ho admits to daytime somnolence and  denies waking up still tired. Patient has a history of obstructive sleep apnea with the use of CPAP. Patent has a history of symptoms of daytime fatigue. Patient generally gets 7 hours of sleep per night, and states they generally have generally restful  sleep. Snoring is not present. Apneic episodes are present. Epworth Sleepiness Score is 2.  Dyspnea on exertion Timothy Ho notes increasing shortness of breath with exercising and seems to be worsening over time with weight gain. He notes getting out of breath sooner with activity than he used to. This has not gotten worse recently. EKG-Normal sinus rhythm 74 BPM. Timothy Ho denies orthopnea.  Hyperlipidemia Timothy Ho has hyperlipidemia and has been trying to improve his cholesterol levels with intensive lifestyle modification including a low saturated fat diet, exercise and weight loss. His LDL was elevated at last appointment on 06/10/17. He is not on any medications and denies any chest pain, claudication or myalgias.  Hypertension Timothy Ho is a 53 y.o. male with hypertension. Timothy Ho has had numerous elevated blood pressure readings. He denies chest pain, chest pressure, or headaches. He is working weight loss to help control his blood pressure with the goal of decreasing his risk of heart attack and stroke. Timothy Ho's blood pressure is not currently controlled.  At risk for cardiovascular disease Timothy Ho is at a higher than average risk for cardiovascular disease due to obesity, hyperlipidemia, and hypertension. He currently denies any chest pain.  Depression Screen Timothy Ho's Food and Mood (modified PHQ-9) score was  Depression screen PHQ 2/9 01/11/2018  Decreased Interest 1  Down, Depressed, Hopeless 0  PHQ - 2 Score 1  Altered sleeping 1  Tired, decreased energy 1  Change in appetite 1  Feeling bad or failure about yourself  0  Trouble concentrating 0  Moving slowly or fidgety/restless  0  Suicidal thoughts 0  PHQ-9 Score 4  Difficult doing work/chores Not difficult at all    ASSESSMENT AND PLAN:  Other fatigue - Plan: EKG 12-Lead, Vitamin B12, Comprehensive metabolic panel, Hemoglobin A1c, Insulin, random, Folate, T3, T4, free, TSH, VITAMIN D 25 Hydroxy (Vit-D Deficiency,  Fractures)  Shortness of breath on exertion - Plan: CBC With Differential  Other hyperlipidemia - Plan: Lipid Panel With LDL/HDL Ratio  Essential hypertension - Plan: Comprehensive metabolic panel  At risk for heart disease  Depression screening  Class 3 severe obesity with serious comorbidity and body mass index (BMI) of 45.0 to 49.9 in adult, unspecified obesity type (HCC)  PLAN:  Fatigue Timothy Ho was informed that his fatigue may be related to obesity, depression or many other causes. Labs will be ordered, and in the meanwhile Timothy Ho has agreed to work on diet, exercise and weight loss to help with fatigue. Proper sleep hygiene was discussed including the need for 7-8 hours of quality sleep each night. A sleep study was not ordered based on symptoms and Epworth score.  Dyspnea on exertion Timothy Ho's shortness of breath appears to be obesity related and exercise induced. He has agreed to work on weight loss and gradually increase exercise to treat his exercise induced shortness of breath. If Timothy Ho follows our instructions and loses weight without improvement of his shortness of breath, we will plan to refer to pulmonology. We will monitor this condition regularly. Timothy Ho agrees to this plan.  Hyperlipidemia Timothy Ho was informed of the American Heart Association Guidelines emphasizing intensive lifestyle modifications as the first line treatment for hyperlipidemia. We discussed many lifestyle modifications today in depth, and Timothy Ho will continue to work on decreasing saturated fats such as fatty red meat, butter and many fried foods. He will also increase vegetables and lean protein in his diet and continue to work on exercise and weight loss efforts. We will check FLP today. Timothy Ho agrees to follow up with our clinic in 2 weeks.  Hypertension We discussed sodium restriction, working on healthy weight loss, and a regular exercise program as the means to achieve improved blood pressure  control. Timothy Ho agreed with this plan and agreed to follow up as directed. We will continue to monitor his blood pressure as well as his progress with the above lifestyle modifications. He will watch for signs of hypotension as he continues his lifestyle modifications. We will check FLP and CMP today and follow up on blood pressure at next appointment. Timothy Ho agrees to follow up with our clinic in 2 weeks.  Cardiovascular risk counselling Timothy Ho was given extended (15 minutes) coronary artery disease prevention counseling today. He is 53 y.o. male and has risk factors for heart disease including obesity, hyperlipidemia, and hypertension. We discussed intensive lifestyle modifications today with an emphasis on specific weight loss instructions and strategies. Pt was also informed of the importance of increasing exercise and decreasing saturated fats to help prevent heart disease.  Depression Screen Timothy Ho had a negative depression screening. Depression is commonly associated with obesity and often results in emotional eating behaviors. We will monitor this closely and work on CBT to help improve the non-hunger eating patterns. Referral to Psychology may be required if no improvement is seen as he continues in our clinic.  Obesity Timothy Ho is currently in the action stage of change and his goal is to continue with weight loss efforts. I recommend Flabio begin the structured treatment plan as follows:  He has agreed to follow the Category 4 plan +  100 calories Timothy LongsJoseph has been instructed to eventually work up to a goal of 150 minutes of combined cardio and strengthening exercise per week for weight loss and overall health benefits. We discussed the following Behavioral Modification Strategies today: increasing lean protein intake, increasing vegetables, work on meal planning and easy cooking plans, better snacking choices, and keep a strict food journal   He was informed of the importance of frequent follow  up visits to maximize his success with intensive lifestyle modifications for his multiple health conditions. He was informed we would discuss his lab results at his next visit unless there is a critical issue that needs to be addressed sooner. Timothy LongsJoseph agreed to keep his next visit at the agreed upon time to discuss these results.  ALLERGIES: No Known Allergies  MEDICATIONS: No current outpatient medications on file prior to visit.   No current facility-administered medications on file prior to visit.     PAST MEDICAL HISTORY: Past Medical History:  Diagnosis Date  . Chronic rhinitis   . H/O: pneumonia 2013  . Sleep apnea     PAST SURGICAL HISTORY: Past Surgical History:  Procedure Laterality Date  . COLONOSCOPY WITH PROPOFOL N/A 09/10/2017   Procedure: COLONOSCOPY WITH PROPOFOL;  Surgeon: Midge MiniumWohl, Darren, MD;  [facility];  Service: Endoscopy;  Laterality: N/A;  . none      SOCIAL HISTORY: Social History   Tobacco Use  . Smoking status: Never Smoker  . Smokeless tobacco: Never Used  . Tobacco comment: Father smoked heavily  Substance Use Topics  . Alcohol use: Yes    Alcohol/week: 1.0 - 2.0 standard drinks    Types: 1 - 2 Cans of beer per week    Comment: Beer once or twice a week  . Drug use: No    FAMILY HISTORY: Family History  Problem Relation Age of Onset  . Diabetes Mother   . Hypertension Mother   . Obesity Mother   . Crohn's disease Sister   . Lung disease Neg Hx   . Rheumatologic disease Neg Hx   . Cancer Neg Hx     ROS: Review of Systems  Constitutional: Positive for malaise/fatigue. Negative for weight loss.  Eyes:       + Wear glasses or contacts  Respiratory: Sputum production: with exertion.   Cardiovascular: Negative for chest pain, orthopnea and claudication.       Negative chest pressure  Musculoskeletal: Negative for myalgias.  Neurological: Negative for headaches.    PHYSICAL EXAM: Blood pressure (!) 143/79, pulse 78,  temperature 98 F (36.7 C), temperature source Oral, height 6\' 4"  (1.93 m), weight (!) 372 lb (168.7 kg), SpO2 95 %. Body mass index is 45.28 kg/m. Physical Exam Vitals signs reviewed.  Constitutional:      Appearance: Normal appearance. He is obese.  HENT:     Head: Normocephalic and atraumatic.     Nose: Nose normal.  Eyes:     General: No scleral icterus.    Extraocular Movements: Extraocular movements intact.  Neck:     Musculoskeletal: Normal range of motion and neck supple.     Comments: Negative thyromegaly Cardiovascular:     Rate and Rhythm: Normal rate and regular rhythm.     Pulses: Normal pulses.     Heart sounds: Normal heart sounds.  Pulmonary:     Effort: Pulmonary effort is normal. No respiratory distress.  Abdominal:     Palpations: Abdomen is soft.     Tenderness: There is no  abdominal tenderness.     Comments: + Obesity  Musculoskeletal: Normal range of motion.     Right lower leg: No edema.     Left lower leg: No edema.  Neurological:     Mental Status: He is alert and oriented to person, place, and time.     Coordination: Coordination normal.  Psychiatric:        Mood and Affect: Mood normal.        Behavior: Behavior normal.     RECENT LABS AND TESTS: BMET    Component Value Date/Time   NA 142 01/11/2018 1258   K 4.2 01/11/2018 1258   CL 106 01/11/2018 1258   CO2 22 01/11/2018 1258   GLUCOSE 90 01/11/2018 1258   GLUCOSE 94 06/10/2017 1547   BUN 10 01/11/2018 1258   CREATININE 0.81 01/11/2018 1258   CALCIUM 9.1 01/11/2018 1258   GFRNONAA 101 01/11/2018 1258   GFRAA 116 01/11/2018 1258   Lab Results  Component Value Date   HGBA1C 5.6 01/11/2018   Lab Results  Component Value Date   INSULIN 15.2 01/11/2018   CBC    Component Value Date/Time   WBC 5.6 01/11/2018 1258   WBC 6.7 10/11/2015 1157   RBC 5.12 01/11/2018 1258   RBC 5.19 10/11/2015 1157   HGB 15.0 01/11/2018 1258   HCT 43.8 01/11/2018 1258   PLT 238.0 10/11/2015 1157    MCV 86 01/11/2018 1258   MCH 29.3 01/11/2018 1258   MCHC 34.2 01/11/2018 1258   MCHC 33.1 10/11/2015 1157   RDW 12.8 01/11/2018 1258   LYMPHSABS 1.7 01/11/2018 1258   MONOABS 0.6 10/11/2015 1157   EOSABS 0.1 01/11/2018 1258   BASOSABS 0.1 01/11/2018 1258   Iron/TIBC/Ferritin/ %Sat No results found for: IRON, TIBC, FERRITIN, IRONPCTSAT Lipid Panel     Component Value Date/Time   CHOL 174 01/11/2018 1258   TRIG 101 01/11/2018 1258   HDL 39 (L) 01/11/2018 1258   CHOLHDL 6 06/10/2017 1547   VLDL 31.2 06/10/2017 1547   LDLCALC 115 (H) 01/11/2018 1258   Hepatic Function Panel     Component Value Date/Time   PROT 7.0 01/11/2018 1258   ALBUMIN 4.4 01/11/2018 1258   AST 35 01/11/2018 1258   ALT 47 (H) 01/11/2018 1258   ALKPHOS 81 01/11/2018 1258   BILITOT 0.7 01/11/2018 1258      Component Value Date/Time   TSH WILL FOLLOW 01/11/2018 1258    ECG  shows NSR with a rate of 74 BPM INDIRECT CALORIMETER done today shows a VO2 of 447 and a REE of 3113.  His calculated basal metabolic rate is [id number] thus his basal metabolic rate is worse than expected.       OBESITY BEHAVIORAL INTERVENTION VISIT  Today's visit was # 1   Starting weight: 372 lbs Starting date: 01/11/2018 Today's weight : 372 lbs  Today's date: 01/11/2018 Total lbs lost to date: 0    ASK: We discussed the diagnosis of obesity with Timothy NettleJoseph B Maradiaga today and Timothy LongsJoseph agreed to give us permission to discuss obesity behavioral modification therapy today.  ASSESS: Timothy LongsJoseph has the diagnosis of obesity and his BMI today is 3845.3 Timothy LongsJoseph is in the action stage of change   ADVISE: Timothy LongsJoseph was educated on the multiple health risks of obesity as well as the benefit of weight loss to improve his health. He was advised of the need for long term treatment and the importance of lifestyle modifications to improve his current health and  to decrease his risk of future health problems.  AGREE: Multiple dietary modification  options and treatment options were discussed and  Caileb agreed to follow the recommendations documented in the above note.  ARRANGE: Orval was educated on the importance of frequent visits to treat obesity as outlined per CMS and USPSTF guidelines and agreed to schedule his next follow up appointment today.  I, Burt Knack, am acting as transcriptionist for Debbra Riding, MD  I have reviewed the above documentation for accuracy and completeness, and I agree with the above. - Debbra Riding, MD

## 2018-01-13 LAB — COMPREHENSIVE METABOLIC PANEL
ALT: 47 IU/L — ABNORMAL HIGH (ref 0–44)
AST: 35 IU/L (ref 0–40)
Albumin/Globulin Ratio: 1.7 (ref 1.2–2.2)
Albumin: 4.4 g/dL (ref 3.5–5.5)
Alkaline Phosphatase: 81 IU/L (ref 39–117)
BUN/Creatinine Ratio: 12 (ref 9–20)
BUN: 10 mg/dL (ref 6–24)
Bilirubin Total: 0.7 mg/dL (ref 0.0–1.2)
CO2: 22 mmol/L (ref 20–29)
Calcium: 9.1 mg/dL (ref 8.7–10.2)
Chloride: 106 mmol/L (ref 96–106)
Creatinine, Ser: 0.81 mg/dL (ref 0.76–1.27)
GFR calc Af Amer: 116 mL/min/{1.73_m2} (ref >59)
GFR calc non Af Amer: 101 mL/min/{1.73_m2} (ref >59)
Globulin, Total: 2.6 g/dL (ref 1.5–4.5)
Glucose: 90 mg/dL (ref 65–99)
Potassium: 4.2 mmol/L (ref 3.5–5.2)
Sodium: 142 mmol/L (ref 134–144)
Total Protein: 7 g/dL (ref 6.0–8.5)

## 2018-01-13 LAB — CBC WITH DIFFERENTIAL
Basophils Absolute: 0.1 10*3/uL (ref 0.0–0.2)
Basos: 1 %
EOS (ABSOLUTE): 0.1 10*3/uL (ref 0.0–0.4)
EOS: 2 %
HEMATOCRIT: 43.8 % (ref 37.5–51.0)
HEMOGLOBIN: 15 g/dL (ref 13.0–17.7)
IMMATURE GRANS (ABS): 0 10*3/uL (ref 0.0–0.1)
Immature Granulocytes: 0 %
LYMPHS: 30 %
Lymphocytes Absolute: 1.7 10*3/uL (ref 0.7–3.1)
MCH: 29.3 pg (ref 26.6–33.0)
MCHC: 34.2 g/dL (ref 31.5–35.7)
MCV: 86 fL (ref 79–97)
MONOCYTES: 9 %
Monocytes Absolute: 0.5 10*3/uL (ref 0.1–0.9)
Neutrophils Absolute: 3.2 10*3/uL (ref 1.4–7.0)
Neutrophils: 58 %
RBC: 5.12 x10E6/uL (ref 4.14–5.80)
RDW: 12.8 % (ref 11.6–15.4)
WBC: 5.6 10*3/uL (ref 3.4–10.8)

## 2018-01-13 LAB — INSULIN, RANDOM: INSULIN: 15.2 u[IU]/mL (ref 2.6–24.9)

## 2018-01-13 LAB — HEMOGLOBIN A1C
ESTIMATED AVERAGE GLUCOSE: 114 mg/dL
Hgb A1c MFr Bld: 5.6 % (ref 4.8–5.6)

## 2018-01-13 LAB — VITAMIN B12: Vitamin B-12: 253 pg/mL (ref 232–1245)

## 2018-01-13 LAB — LIPID PANEL WITH LDL/HDL RATIO
Cholesterol, Total: 174 mg/dL (ref 100–199)
HDL: 39 mg/dL — ABNORMAL LOW (ref >39)
LDL Calculated: 115 mg/dL — ABNORMAL HIGH (ref 0–99)
LDl/HDL Ratio: 2.9 ratio (ref 0.0–3.6)
Triglycerides: 101 mg/dL (ref 0–149)
VLDL Cholesterol Cal: 20 mg/dL (ref 5–40)

## 2018-01-13 LAB — T4, FREE: Free T4: 1.18 ng/dL (ref 0.82–1.77)

## 2018-01-13 LAB — VITAMIN D 25 HYDROXY (VIT D DEFICIENCY, FRACTURES): Vit D, 25-Hydroxy: 10.7 ng/mL — ABNORMAL LOW (ref 30.0–100.0)

## 2018-01-13 LAB — T3: T3, Total: 131 ng/dL (ref 71–180)

## 2018-01-13 LAB — FOLATE: Folate: 12.7 ng/mL (ref >3.0)

## 2018-01-13 LAB — TSH: TSH: 1.73 u[IU]/mL (ref 0.450–4.500)

## 2018-01-22 DIAGNOSIS — G4733 Obstructive sleep apnea (adult) (pediatric): Secondary | ICD-10-CM | POA: Diagnosis not present

## 2018-01-25 ENCOUNTER — Ambulatory Visit (INDEPENDENT_AMBULATORY_CARE_PROVIDER_SITE_OTHER): Payer: 59 | Admitting: Family Medicine

## 2018-01-27 ENCOUNTER — Ambulatory Visit (INDEPENDENT_AMBULATORY_CARE_PROVIDER_SITE_OTHER): Payer: 59 | Admitting: Family Medicine

## 2018-01-27 ENCOUNTER — Encounter (INDEPENDENT_AMBULATORY_CARE_PROVIDER_SITE_OTHER): Payer: Self-pay | Admitting: Family Medicine

## 2018-01-27 VITALS — BP 139/73 | HR 73 | Temp 98.5°F | Ht 76.0 in | Wt 358.0 lb

## 2018-01-27 DIAGNOSIS — K760 Fatty (change of) liver, not elsewhere classified: Secondary | ICD-10-CM

## 2018-01-27 DIAGNOSIS — Z6841 Body Mass Index (BMI) 40.0 and over, adult: Secondary | ICD-10-CM | POA: Diagnosis not present

## 2018-01-27 DIAGNOSIS — R7303 Prediabetes: Secondary | ICD-10-CM

## 2018-01-27 DIAGNOSIS — E782 Mixed hyperlipidemia: Secondary | ICD-10-CM | POA: Diagnosis not present

## 2018-01-27 DIAGNOSIS — Z9189 Other specified personal risk factors, not elsewhere classified: Secondary | ICD-10-CM

## 2018-01-27 DIAGNOSIS — E559 Vitamin D deficiency, unspecified: Secondary | ICD-10-CM | POA: Diagnosis not present

## 2018-01-27 MED ORDER — VITAMIN D (ERGOCALCIFEROL) 1.25 MG (50000 UNIT) PO CAPS: 50000.0000 [IU] | ORAL_CAPSULE | ORAL | 0 refills | Status: DC

## 2018-01-27 MED FILL — VIT D2 1.25 MG (50,000 UNIT: 1.25 MG | 28 days supply | Qty: 4 | Fill #0

## 2018-01-27 NOTE — Progress Notes (Signed)
Office: [phone]  /  Fax: [phone]   HPI:   Chief Complaint: OBESITY Timothy Ho is here to discuss his progress with his obesity treatment plan. He is on the Category 4 plan + 100 calories and is following his eating plan approximately 100 % of the time. He states he is on the elliptical, treadmill, and lifting weights for 60 minutes 3-4 times per week. Timothy Ho did well with weight loss on his Category 4 plan. He notes his hunger was controlled, but felt the food was bland. His wife agreed and states she felt inmates ate better flavored food than him. He also wishes he could drink more whisky on his plan.  His weight is (!) 358 lb (162.4 kg) today and has had a weight loss of 14 pounds over a period of 2 weeks since his last visit. He has lost 14 lbs since starting treatment with [place].  Vitamin D Deficiency Timothy Ho has a new diagnosis of vitamin D deficiency. He is not currently taking Vit D. He notes fatigue and denies nausea, vomiting or muscle weakness.  Pre-Diabetes Timothy Ho has a new diagnosis of pre-diabetes based on his elevated Hgb A1c and was informed this puts him at greater risk of developing diabetes. He is not taking metformin currently and notes decreased polyphagia on Category 4 plan. He continues to work on diet and exercise to decrease risk of diabetes. He denies hypoglycemia.  At risk for diabetes Timothy Ho is at higher than average risk for developing diabetes due to his obesity and pre-diabetes. He currently denies polyuria or polydipsia.  Hyperlipidemia (Mixed) Timothy Ho has hyperlipidemia and would like to improve his cholesterol levels with intensive lifestyle modification including a low saturated fat diet, exercise, and weight loss. He denies any chest pain, claudication or myalgias.  Non Alcoholic Fatty Liver Disease Timothy Ho has a diagnosis of mildly elevated ALT. His BMI is over 40. He denies abdominal pain or jaundice and he does like to drink whisky, but has decreased  on diet prescription.  ASSESSMENT AND PLAN:  Vitamin D deficiency - Plan: Vitamin D, Ergocalciferol, (DRISDOL) 1.25 MG (50000 UT) CAPS capsule  Prediabetes  Mixed hyperlipidemia  Non-alcoholic fatty liver disease  At risk for diabetes mellitus  Class 3 severe obesity with serious comorbidity and body mass index (BMI) of 40.0 to 44.9 in adult, unspecified obesity type (HCC)  PLAN:  Vitamin D Deficiency Timothy Ho was informed that low vitamin D levels contributes to fatigue and are associated with obesity, breast, and colon cancer. Timothy Ho agrees to start prescription Vit D @50 ,000 IU every week #4 with no refills. He will follow up for routine testing of vitamin D, at least 2-3 times per year. He was informed of the risk of over-replacement of vitamin D and agrees to not increase his dose unless he discusses this with [place] first. We will recheck labs in 3 months. Timothy Ho agrees to follow up with our clinic in 2 weeks with Timothy Ho, Timothy Ho.  Timothy Ho will continue to work on weight loss, diet, exercise, and decreasing simple carbohydrates in his diet to help decrease the risk of diabetes. We dicussed metformin including benefits and risks. He was informed that eating too many simple carbohydrates or too many calories at one sitting increases the likelihood of GI side effects. Timothy Ho declined metformin for now and a prescription was not written today. Timothy Ho agrees to follow up with our clinic in 2 weeks with Timothy Ho, Timothy Ho as directed to monitor his progress.  Diabetes risk counselling  Timothy Ho was given extended (30 minutes) diabetes prevention counseling today. He is 53 y.o. male and has risk factors for diabetes including obesity and pre-diabetes. We discussed intensive lifestyle modifications today with an emphasis on weight loss as well as increasing exercise and decreasing simple carbohydrates in his diet.  Hyperlipidemia (Mixed) Timothy Ho was informed of the American  Heart Association Guidelines emphasizing intensive lifestyle modifications as the first line treatment for hyperlipidemia. We discussed many lifestyle modifications today in depth, and Timothy Ho will continue to work on decreasing saturated fats such as fatty red meat, butter and many fried foods. He will also increase vegetables and lean protein in his diet and continue to work on diet, exercise, and weight loss efforts. We will recheck labs in 3 months. Timothy Ho agrees to follow up with our clinic in 2 weeks with Timothy RidingAlexandria Ho, Timothy Ho.  Non Alcoholic Fatty Liver Disease We discussed the likely diagnosis of non alcoholic fatty liver disease today and how this condition is obesity related. Timothy Ho was educated on fatty liver, weight loss, and his risk of developing NASH or even liver failure and th only proven treatment for NAFLD was weight loss. Timothy Ho agreed to continue with his weight loss efforts with healthier diet and exercise as an essential part of his treatment plan. We will recheck labs in 3 months. Timothy Ho agrees to follow up with our clinic in 2 weeks with Timothy RidingAlexandria Ho, Timothy Ho.  Obesity Timothy Ho is currently in the action stage of change. As such, his goal is to continue with weight loss efforts He has agreed to follow the Category 4 plan + 100 calories Timothy Ho has been instructed to work up to a goal of 150 minutes of combined cardio and strengthening exercise per week for weight loss and overall health benefits. We discussed the following Behavioral Modification Strategies today: increasing lean protein intake, decreasing simple carbohydrates, work on meal planning and easy cooking plans and decrease liquid calories Timothy Ho was educated on the high calorie sauces and oils, and he was educated on lower calorie flavorings, spices, etc.  Timothy Ho has agreed to follow up with our clinic in 2 weeks with Timothy RidingAlexandria Ho, Timothy Ho. He was informed of the importance of frequent follow up visits to maximize his  success with intensive lifestyle modifications for his multiple health conditions.  ALLERGIES: No Known Allergies  MEDICATIONS: No current outpatient medications on file prior to visit.   No current facility-administered medications on file prior to visit.     PAST MEDICAL HISTORY: Past Medical History:  Diagnosis Date  . Chronic rhinitis   . H/O: pneumonia 2013  . Sleep apnea     PAST SURGICAL HISTORY: Past Surgical History:  Procedure Laterality Date  . COLONOSCOPY WITH PROPOFOL N/A 09/10/2017   Procedure: COLONOSCOPY WITH PROPOFOL;  Surgeon: Midge MiniumWohl, Darren, Timothy Ho;  [facility];  Service: Endoscopy;  Laterality: N/A;  . none      SOCIAL HISTORY: Social History   Tobacco Use  . Smoking status: Never Smoker  . Smokeless tobacco: Never Used  . Tobacco comment: Father smoked heavily  Substance Use Topics  . Alcohol use: Yes    Alcohol/week: 1.0 - 2.0 standard drinks    Types: 1 - 2 Cans of beer per week    Comment: Beer once or twice a week  . Drug use: No    FAMILY HISTORY: Family History  Problem Relation Age of Onset  . Diabetes Mother   . Hypertension Mother   . Obesity Mother   .  Crohn's disease Sister   . Lung disease Neg Hx   . Rheumatologic disease Neg Hx   . Cancer Neg Hx     ROS: Review of Systems  Constitutional: Positive for malaise/fatigue and weight loss.  Eyes:       Negative jaundice  Cardiovascular: Negative for chest pain and claudication.  Gastrointestinal: Negative for abdominal pain, nausea and vomiting.  Genitourinary: Negative for frequency.  Musculoskeletal: Negative for myalgias.       Negative muscle weakness  Endo/Heme/Allergies: Negative for polydipsia.       Positive polyphagia Negative hypoglycemia    PHYSICAL EXAM: Blood pressure 139/73, pulse 73, temperature 98.5 F (36.9 C), temperature source Oral, height 6\' 4"  (1.93 m), weight (!) 358 lb (162.4 kg), SpO2 94 %. Body mass index is 43.58 kg/m. Physical  Exam Vitals signs reviewed.  Constitutional:      Appearance: Normal appearance. He is obese.  Cardiovascular:     Rate and Rhythm: Normal rate.     Pulses: Normal pulses.  Pulmonary:     Effort: Pulmonary effort is normal.     Breath sounds: Normal breath sounds.  Musculoskeletal: Normal range of motion.  Skin:    General: Skin is warm and dry.  Neurological:     Mental Status: He is alert and oriented to person, place, and time.  Psychiatric:        Mood and Affect: Mood normal.        Behavior: Behavior normal.     RECENT LABS AND TESTS: BMET    Component Value Date/Time   NA 142 01/11/2018 1258   K 4.2 01/11/2018 1258   CL 106 01/11/2018 1258   CO2 22 01/11/2018 1258   GLUCOSE 90 01/11/2018 1258   GLUCOSE 94 06/10/2017 1547   BUN 10 01/11/2018 1258   CREATININE 0.81 01/11/2018 1258   CALCIUM 9.1 01/11/2018 1258   GFRNONAA 101 01/11/2018 1258   GFRAA 116 01/11/2018 1258   Lab Results  Component Value Date   HGBA1C 5.6 01/11/2018   Lab Results  Component Value Date   INSULIN 15.2 01/11/2018   CBC    Component Value Date/Time   WBC 5.6 01/11/2018 1258   WBC 6.7 10/11/2015 1157   RBC 5.12 01/11/2018 1258   RBC 5.19 10/11/2015 1157   HGB 15.0 01/11/2018 1258   HCT 43.8 01/11/2018 1258   PLT 238.0 10/11/2015 1157   MCV 86 01/11/2018 1258   MCH 29.3 01/11/2018 1258   MCHC 34.2 01/11/2018 1258   MCHC 33.1 10/11/2015 1157   RDW 12.8 01/11/2018 1258   LYMPHSABS 1.7 01/11/2018 1258   MONOABS 0.6 10/11/2015 1157   EOSABS 0.1 01/11/2018 1258   BASOSABS 0.1 01/11/2018 1258   Iron/TIBC/Ferritin/ %Sat No results found for: IRON, TIBC, FERRITIN, IRONPCTSAT Lipid Panel     Component Value Date/Time   CHOL 174 01/11/2018 1258   TRIG 101 01/11/2018 1258   HDL 39 (L) 01/11/2018 1258   CHOLHDL 6 06/10/2017 1547   VLDL 31.2 06/10/2017 1547   LDLCALC 115 (H) 01/11/2018 1258   Hepatic Function Panel     Component Value Date/Time   PROT 7.0 01/11/2018 1258    ALBUMIN 4.4 01/11/2018 1258   AST 35 01/11/2018 1258   ALT 47 (H) 01/11/2018 1258   ALKPHOS 81 01/11/2018 1258   BILITOT 0.7 01/11/2018 1258      Component Value Date/Time   TSH 1.730 01/11/2018 1258      OBESITY BEHAVIORAL INTERVENTION VISIT  Today's  visit was # 2   Starting weight: 372 lbs Starting date: 01/11/2018 Today's weight : 358 lbs Today's date: 01/27/2018 Total lbs lost to date: 14    ASK: We discussed the diagnosis of obesity with Patricia Nettle today and Timothy Longs agreed to give [place] permission to discuss obesity behavioral modification therapy today.  ASSESS: Judea has the diagnosis of obesity and his BMI today is 43.6 Esco is in the action stage of change   ADVISE: Dorse was educated on the multiple health risks of obesity as well as the benefit of weight loss to improve his health. He was advised of the need for long term treatment and the importance of lifestyle modifications to improve his current health and to decrease his risk of future health problems.  AGREE: Multiple dietary modification options and treatment options were discussed and  Sabastien agreed to follow the recommendations documented in the above note.  ARRANGE: Holten was educated on the importance of frequent visits to treat obesity as outlined per CMS and USPSTF guidelines and agreed to schedule his next follow up appointment today.  I, Burt Knack, am acting as transcriptionist for Quillian Quince, Timothy Ho  I have reviewed the above documentation for accuracy and completeness, and I agree with the above. -Quillian Quince, Timothy Ho

## 2018-02-01 DIAGNOSIS — G4733 Obstructive sleep apnea (adult) (pediatric): Secondary | ICD-10-CM | POA: Diagnosis not present

## 2018-02-10 ENCOUNTER — Ambulatory Visit (INDEPENDENT_AMBULATORY_CARE_PROVIDER_SITE_OTHER): Payer: 59 | Admitting: Family Medicine

## 2018-02-10 ENCOUNTER — Encounter (INDEPENDENT_AMBULATORY_CARE_PROVIDER_SITE_OTHER): Payer: Self-pay | Admitting: Family Medicine

## 2018-02-10 VITALS — BP 138/74 | HR 67 | Temp 98.2°F | Ht 76.0 in | Wt 352.0 lb

## 2018-02-10 DIAGNOSIS — Z6841 Body Mass Index (BMI) 40.0 and over, adult: Secondary | ICD-10-CM

## 2018-02-10 DIAGNOSIS — E7849 Other hyperlipidemia: Secondary | ICD-10-CM

## 2018-02-10 DIAGNOSIS — E559 Vitamin D deficiency, unspecified: Secondary | ICD-10-CM

## 2018-02-12 NOTE — Progress Notes (Signed)
Office: [phone]  /  Fax: [phone]   HPI:   Chief Complaint: OBESITY Timothy Ho is here to discuss his progress with his obesity treatment plan. He is on the Category 4 plan + 100 calories and is following his eating plan approximately 99.9 % of the time. He states he is doing cardio for 35 minutes 4 times per week. Kodee found the last few weeks to be easier than the first 2 weeks. He is having some cravings, and using snack calories for crackers, rice cakes, and what's on the list. He is craving pecan swirls that he used to eat before. He is wondering when his snack calories will increase.  His weight is (!) 352 lb (159.7 kg) today and has had a weight loss of 6 pounds over a period of 2 weeks since his last visit. He has lost 20 lbs since starting treatment with [place].  Vitamin D Deficiency Timothy Ho has a diagnosis of vitamin D deficiency. He is currently taking prescription Vit D. He notes fatigue and denies nausea, vomiting or muscle weakness.  Hyperlipidemia Timothy Ho has hyperlipidemia and has been trying to improve his cholesterol levels with intensive lifestyle modification including a low saturated fat diet, exercise and weight loss. His LDL is of 115 and he is not on statin. He denies any chest pain, claudication or myalgias.  ASSESSMENT AND PLAN:  Vitamin D deficiency  Other hyperlipidemia  Class 3 severe obesity with serious comorbidity and body mass index (BMI) of 40.0 to 44.9 in adult, unspecified obesity type (HCC)  PLAN:  Vitamin D Deficiency Timothy Ho was informed that low vitamin D levels contributes to fatigue and are associated with obesity, breast, and colon cancer. Timothy Ho agrees to continue taking prescription Vit D @50 ,000 IU every week, no refill needed. He will follow up for routine testing of vitamin D, at least 2-3 times per year. He was informed of the risk of over-replacement of vitamin D and agrees to not increase his dose unless he discusses this with [place]  first. Timothy Ho agrees to follow up with our clinic in 2 weeks.  Hyperlipidemia Timothy Ho was informed of the American Heart Association Guidelines emphasizing intensive lifestyle modifications as the first line treatment for hyperlipidemia. We discussed many lifestyle modifications today in depth, and Timothy Ho will continue to work on decreasing saturated fats such as fatty red meat, butter and many fried foods. He will also increase vegetables and lean protein in his diet and continue to work on exercise and weight loss efforts. We will repeat FLP in mid April. Chi agrees to follow up with our clinic in 2 weeks.  I spent > than 50% of the 15 minute visit on counseling as documented in the note.  Obesity Timothy Ho is currently in the action stage of change. As such, his goal is to continue with weight loss efforts He has agreed to follow the Category 4 plan + 100 calories Timothy Ho has been instructed to work up to a goal of 150 minutes of combined cardio and strengthening exercise per week for weight loss and overall health benefits. We discussed the following Behavioral Modification Strategies today: increasing lean protein intake, increasing vegetables and work on meal planning and easy cooking plans, and planning for success   Timothy Ho has agreed to follow up with our clinic in 2 weeks. He was informed of the importance of frequent follow up visits to maximize his success with intensive lifestyle modifications for his multiple health conditions.  ALLERGIES: No Known Allergies  MEDICATIONS: Current  Outpatient Medications on File Prior to Visit  Medication Sig Dispense Refill  . Vitamin D, Ergocalciferol, (DRISDOL) 1.25 MG (50000 UT) CAPS capsule Take 1 capsule (50,000 Units total) by mouth every 7 (seven) days. 4 capsule 0   No current facility-administered medications on file prior to visit.     PAST MEDICAL HISTORY: Past Medical History:  Diagnosis Date  . Chronic rhinitis   . H/O:  pneumonia 2013  . Sleep apnea     PAST SURGICAL HISTORY: Past Surgical History:  Procedure Laterality Date  . COLONOSCOPY WITH PROPOFOL N/A 09/10/2017   Procedure: COLONOSCOPY WITH PROPOFOL;  Surgeon: Midge MiniumWohl, Darren, MD;  [facility];  Service: Endoscopy;  Laterality: N/A;  . none      SOCIAL HISTORY: Social History   Tobacco Use  . Smoking status: Never Smoker  . Smokeless tobacco: Never Used  . Tobacco comment: Father smoked heavily  Substance Use Topics  . Alcohol use: Yes    Alcohol/week: 1.0 - 2.0 standard drinks    Types: 1 - 2 Cans of beer per week    Comment: Beer once or twice a week  . Drug use: No    FAMILY HISTORY: Family History  Problem Relation Age of Onset  . Diabetes Mother   . Hypertension Mother   . Obesity Mother   . Crohn's disease Sister   . Lung disease Neg Hx   . Rheumatologic disease Neg Hx   . Cancer Neg Hx     ROS: Review of Systems  Constitutional: Positive for malaise/fatigue and weight loss.  Cardiovascular: Negative for chest pain and claudication.  Gastrointestinal: Negative for nausea and vomiting.  Musculoskeletal: Negative for myalgias.       Negative muscle weakness    PHYSICAL EXAM: Blood pressure 138/74, pulse 67, temperature 98.2 F (36.8 C), temperature source Oral, height 6\' 4"  (1.93 m), weight (!) 352 lb (159.7 kg), SpO2 97 %. Body mass index is 42.85 kg/m. Physical Exam Vitals signs reviewed.  Constitutional:      Appearance: Normal appearance. He is obese.  Cardiovascular:     Rate and Rhythm: Normal rate.     Pulses: Normal pulses.  Pulmonary:     Effort: Pulmonary effort is normal.     Breath sounds: Normal breath sounds.  Musculoskeletal: Normal range of motion.  Skin:    General: Skin is warm and dry.  Neurological:     Mental Status: He is alert and oriented to person, place, and time.  Psychiatric:        Mood and Affect: Mood normal.        Behavior: Behavior normal.     RECENT  LABS AND TESTS: BMET    Component Value Date/Time   NA 142 01/11/2018 1258   K 4.2 01/11/2018 1258   CL 106 01/11/2018 1258   CO2 22 01/11/2018 1258   GLUCOSE 90 01/11/2018 1258   GLUCOSE 94 06/10/2017 1547   BUN 10 01/11/2018 1258   CREATININE 0.81 01/11/2018 1258   CALCIUM 9.1 01/11/2018 1258   GFRNONAA 101 01/11/2018 1258   GFRAA 116 01/11/2018 1258   Lab Results  Component Value Date   HGBA1C 5.6 01/11/2018   Lab Results  Component Value Date   INSULIN 15.2 01/11/2018   CBC    Component Value Date/Time   WBC 5.6 01/11/2018 1258   WBC 6.7 10/11/2015 1157   RBC 5.12 01/11/2018 1258   RBC 5.19 10/11/2015 1157   HGB 15.0 01/11/2018 1258  HCT 43.8 01/11/2018 1258   PLT 238.0 10/11/2015 1157   MCV 86 01/11/2018 1258   MCH 29.3 01/11/2018 1258   MCHC 34.2 01/11/2018 1258   MCHC 33.1 10/11/2015 1157   RDW 12.8 01/11/2018 1258   LYMPHSABS 1.7 01/11/2018 1258   MONOABS 0.6 10/11/2015 1157   EOSABS 0.1 01/11/2018 1258   BASOSABS 0.1 01/11/2018 1258   Iron/TIBC/Ferritin/ %Sat No results found for: IRON, TIBC, FERRITIN, IRONPCTSAT Lipid Panel     Component Value Date/Time   CHOL 174 01/11/2018 1258   TRIG 101 01/11/2018 1258   HDL 39 (L) 01/11/2018 1258   CHOLHDL 6 06/10/2017 1547   VLDL 31.2 06/10/2017 1547   LDLCALC 115 (H) 01/11/2018 1258   Hepatic Function Panel     Component Value Date/Time   PROT 7.0 01/11/2018 1258   ALBUMIN 4.4 01/11/2018 1258   AST 35 01/11/2018 1258   ALT 47 (H) 01/11/2018 1258   ALKPHOS 81 01/11/2018 1258   BILITOT 0.7 01/11/2018 1258      Component Value Date/Time   TSH 1.730 01/11/2018 1258      OBESITY BEHAVIORAL INTERVENTION VISIT  Today's visit was # 3   Starting weight: 372 lbs Starting date: 01/11/2018 Today's weight : 352 lbs  Today's date: 02/10/2018 Total lbs lost to date: 20    ASK: We discussed the diagnosis of obesity with Timothy Ho today and Timothy Ho agreed to give [place] permission to discuss  obesity behavioral modification therapy today.  ASSESS: Timothy Ho has the diagnosis of obesity and his BMI today is 80.86 Timothy Ho is in the action stage of change   ADVISE: Timothy Ho was educated on the multiple health risks of obesity as well as the benefit of weight loss to improve his health. He was advised of the need for long term treatment and the importance of lifestyle modifications to improve his current health and to decrease his risk of future health problems.  AGREE: Multiple dietary modification options and treatment options were discussed and  Timothy Ho agreed to follow the recommendations documented in the above note.  ARRANGE: Timothy Ho was educated on the importance of frequent visits to treat obesity as outlined per CMS and USPSTF guidelines and agreed to schedule his next follow up appointment today.  I, Burt Knack, am acting as transcriptionist for Debbra Riding, MD  I have reviewed the above documentation for accuracy and completeness, and I agree with the above. - Debbra Riding, MD

## 2018-03-01 ENCOUNTER — Encounter (INDEPENDENT_AMBULATORY_CARE_PROVIDER_SITE_OTHER): Payer: Self-pay | Admitting: Family Medicine

## 2018-03-01 ENCOUNTER — Ambulatory Visit (INDEPENDENT_AMBULATORY_CARE_PROVIDER_SITE_OTHER): Payer: 59 | Admitting: Family Medicine

## 2018-03-01 VITALS — BP 123/74 | HR 66 | Temp 97.8°F | Ht 76.0 in | Wt 344.0 lb

## 2018-03-01 DIAGNOSIS — E559 Vitamin D deficiency, unspecified: Secondary | ICD-10-CM | POA: Diagnosis not present

## 2018-03-01 DIAGNOSIS — Z9189 Other specified personal risk factors, not elsewhere classified: Secondary | ICD-10-CM | POA: Diagnosis not present

## 2018-03-01 DIAGNOSIS — Z6841 Body Mass Index (BMI) 40.0 and over, adult: Secondary | ICD-10-CM

## 2018-03-01 DIAGNOSIS — E8881 Metabolic syndrome: Secondary | ICD-10-CM | POA: Diagnosis not present

## 2018-03-01 DIAGNOSIS — E66813 Obesity, class 3: Secondary | ICD-10-CM

## 2018-03-01 DIAGNOSIS — E88819 Insulin resistance, unspecified: Secondary | ICD-10-CM

## 2018-03-02 NOTE — Progress Notes (Signed)
Office: [phone]  /  Fax: [phone]   HPI:   Chief Complaint: OBESITY Timothy Ho is here to discuss his progress with his obesity treatment plan. He is on the  follow the Category 4 plan + 100 calories and is following his eating plan approximately 90 % of the time. He states he is doing cardio for 20-25 minutes 3 times per week. [name] is following the plan for most of the time. He is still enjoying breakfast and doing relatively well at lunch. He is still most of the time enjoying the meal plan, but he is looking for ways to be more creative at dinner.  His weight is (!) 344 lb (156 kg) today and has had a weight loss of 8 pounds over a period of 2 to 3 weeks since his last visit. He has lost 28 lbs since starting treatment with [place].  Vitamin D Deficiency [name] has a diagnosis of vitamin D deficiency. He is currently taking prescription Vit D. He notes fatigue and denies nausea, vomiting or muscle weakness.  At risk for osteopenia and osteoporosis [name] is at higher risk of osteopenia and osteoporosis due to vitamin D deficiency.   Insulin Resistance [name] has a diagnosis of insulin resistance based on his elevated fasting insulin level >5. Although Birney's blood glucose readings are still under good control, insulin resistance puts him at greater risk of metabolic syndrome and diabetes. He is not taking metformin currently and notes minimal carbohydrate cravings but denies indulgences. He continues to work on diet and exercise to decrease risk of diabetes.  ASSESSMENT AND PLAN:  Vitamin D deficiency  Insulin resistance  At risk for osteoporosis  Class 3 severe obesity with serious comorbidity and body mass index (BMI) of 40.0 to 44.9 in adult, unspecified obesity type (HCC)  PLAN:  Vitamin D Deficiency [name] was informed that low vitamin D levels contributes to fatigue and are associated with obesity, breast, and colon cancer. [name] is to discontinue prescription Vit D,  and he change to supplementation OTC Vit D 5,000 IU daily. He will follow up for routine testing of vitamin D, at least 2-3 times per year. He was informed of the risk of over-replacement of vitamin D and agrees to not increase his dose unless he discusses this with [place] first. Arkan agrees to follow up with our clinic in 2 weeks.  At risk for osteopenia and osteoporosis [name] was given extended (15 minutes) osteoporosis prevention counseling today. [name] is at risk for osteopenia and osteoporsis due to his vitamin D deficiency. He was encouraged to take his vitamin D and follow his higher calcium diet and increase strengthening exercise to help strengthen his bones and decrease his risk of osteopenia and osteoporosis.  Insulin Resistance [name] will continue to work on weight loss, exercise, and decreasing simple carbohydrates in his diet to help decrease the risk of diabetes. We dicussed metformin including benefits and risks. He was informed that eating too many simple carbohydrates or too many calories at one sitting increases the likelihood of GI side effects. [name] declined metformin for now and prescription was not written today. We will repeat labs in mid April. [name] agrees to follow up with our clinic in 2 weeks as directed to monitor his progress.  Obesity [name] is currently in the action stage of change. As such, his goal is to continue with weight loss efforts He has agreed to keep a food journal with 550-700 calories and 50+ grams of protein at supper daily and  follow the Category 4 plan + 100 calories Kc has been instructed to work up to a goal of 150 minutes of combined cardio and strengthening exercise per week for weight loss and overall health benefits. We discussed the following Behavioral Modification Strategies today: increasing lean protein intake, increasing vegetables and work on meal planning and easy cooking plans, and planning for success   [name] has agreed to  follow up with our clinic in 2 weeks. He was informed of the importance of frequent follow up visits to maximize his success with intensive lifestyle modifications for his multiple health conditions.  ALLERGIES: No Known Allergies  MEDICATIONS: No current outpatient medications on file prior to visit.   No current facility-administered medications on file prior to visit.     PAST MEDICAL HISTORY: Past Medical History:  Diagnosis Date  . Chronic rhinitis   . H/O: pneumonia 2013  . Sleep apnea     PAST SURGICAL HISTORY: Past Surgical History:  Procedure Laterality Date  . COLONOSCOPY WITH PROPOFOL N/A 09/10/2017   Procedure: COLONOSCOPY WITH PROPOFOL;  Surgeon: Midge Minium, MD;  Location: [facility];  Service: Endoscopy;  Laterality: N/A;  . none      SOCIAL HISTORY: Social History   Tobacco Use  . Smoking status: Never Smoker  . Smokeless tobacco: Never Used  . Tobacco comment: Father smoked heavily  Substance Use Topics  . Alcohol use: Yes    Alcohol/week: 1.0 - 2.0 standard drinks    Types: 1 - 2 Cans of beer per week    Comment: Beer once or twice a week  . Drug use: No    FAMILY HISTORY: Family History  Problem Relation Age of Onset  . Diabetes Mother   . Hypertension Mother   . Obesity Mother   . Crohn's disease Sister   . Lung disease Neg Hx   . Rheumatologic disease Neg Hx   . Cancer Neg Hx     ROS: Review of Systems  Constitutional: Positive for malaise/fatigue and weight loss.  Gastrointestinal: Negative for nausea and vomiting.  Musculoskeletal:       Negative muscle weakness    PHYSICAL EXAM: Blood pressure 123/74, pulse 66, temperature 97.8 F (36.6 C), temperature source Oral, height  (1.93 m), weight (!) 344 lb (156 kg), SpO2 96 %. Body mass index is 41.87 kg/m. Physical Exam Vitals signs reviewed.  Constitutional:      Appearance: Normal appearance. He is obese.  Cardiovascular:     Rate and Rhythm: Normal rate.      Pulses: Normal pulses.  Pulmonary:     Effort: Pulmonary effort is normal.     Breath sounds: Normal breath sounds.  Musculoskeletal: Normal range of motion.  Skin:    General: Skin is warm and dry.  Neurological:     Mental Status: He is alert and oriented to person, place, and time.  Psychiatric:        Mood and Affect: Mood normal.        Behavior: Behavior normal.     RECENT LABS AND TESTS: BMET    Component Value Date/Time   NA 142 01/11/2018 1258   K 4.2 01/11/2018 1258   CL 106 01/11/2018 1258   CO2 22 01/11/2018 1258   GLUCOSE 90 01/11/2018 1258   GLUCOSE 94 06/10/2017 1547   BUN 10 01/11/2018 1258   CREATININE 0.81 01/11/2018 1258   CALCIUM 9.1 01/11/2018 1258   GFRNONAA 101 01/11/2018 1258   GFRAA 116 01/11/2018  1258   Lab Results  Component Value Date   HGBA1C 5.6 01/11/2018   Lab Results  Component Value Date   INSULIN 15.2 01/11/2018   CBC    Component Value Date/Time   WBC 5.6 01/11/2018 1258   WBC 6.7 10/11/2015 1157   RBC 5.12 01/11/2018 1258   RBC 5.19 10/11/2015 1157   HGB 15.0 01/11/2018 1258   HCT 43.8 01/11/2018 1258   PLT 238.0 10/11/2015 1157   MCV 86 01/11/2018 1258   MCH 29.3 01/11/2018 1258   MCHC 34.2 01/11/2018 1258   MCHC 33.1 10/11/2015 1157   RDW 12.8 01/11/2018 1258   LYMPHSABS 1.7 01/11/2018 1258   MONOABS 0.6 10/11/2015 1157   EOSABS 0.1 01/11/2018 1258   BASOSABS 0.1 01/11/2018 1258   Iron/TIBC/Ferritin/ %Sat No results found for: IRON, TIBC, FERRITIN, IRONPCTSAT Lipid Panel     Component Value Date/Time   CHOL 174 01/11/2018 1258   TRIG 101 01/11/2018 1258   HDL 39 (L) 01/11/2018 1258   CHOLHDL 6 06/10/2017 1547   VLDL 31.2 06/10/2017 1547   LDLCALC 115 (H) 01/11/2018 1258   Hepatic Function Panel     Component Value Date/Time   PROT 7.0 01/11/2018 1258   ALBUMIN 4.4 01/11/2018 1258   AST 35 01/11/2018 1258   ALT 47 (H) 01/11/2018 1258   ALKPHOS 81 01/11/2018 1258   BILITOT 0.7 01/11/2018 1258        Component Value Date/Time   TSH 1.730 01/11/2018 1258      OBESITY BEHAVIORAL INTERVENTION VISIT  Today's visit was # 4   Starting weight: 372 lbs Starting date: 01/11/2018 Today's weight : 344 lbs  Today's date: 03/01/2018 Total lbs lost to date: 28    03/01/2018  Height 6\' 4"  (1.93 m)  Weight 344 lb (156 kg) (A)  BMI (Calculated) 41.89  BLOOD PRESSURE - SYSTOLIC 123  BLOOD PRESSURE - DIASTOLIC 74   Body Fat % 39.1 %  Total Body Water (lbs) 150.6 lbs     ASK: We discussed the diagnosis of obesity with Patricia Nettle today and Kayaan agreed to give [place] permission to discuss obesity behavioral modification therapy today.  ASSESS: Madix has the diagnosis of obesity and his BMI today is 41.89 Habib is in the action stage of change   ADVISE: Elii was educated on the multiple health risks of obesity as well as the benefit of weight loss to improve his health. He was advised of the need for long term treatment and the importance of lifestyle modifications to improve his current health and to decrease his risk of future health problems.  AGREE: Multiple dietary modification options and treatment options were discussed and  Delwin agreed to follow the recommendations documented in the above note.  ARRANGE: Starr was educated on the importance of frequent visits to treat obesity as outlined per CMS and USPSTF guidelines and agreed to schedule his next follow up appointment today.  I, Burt Knack, am acting as transcriptionist for Debbra Riding, MD  I have reviewed the above documentation for accuracy and completeness, and I agree with the above. - Debbra Riding, MD

## 2018-03-03 ENCOUNTER — Encounter: Payer: Self-pay | Admitting: Family Medicine

## 2018-03-17 ENCOUNTER — Other Ambulatory Visit: Payer: Self-pay

## 2018-03-17 ENCOUNTER — Encounter (INDEPENDENT_AMBULATORY_CARE_PROVIDER_SITE_OTHER): Payer: Self-pay | Admitting: Physician Assistant

## 2018-03-17 ENCOUNTER — Ambulatory Visit (INDEPENDENT_AMBULATORY_CARE_PROVIDER_SITE_OTHER): Payer: 59 | Admitting: Physician Assistant

## 2018-03-17 VITALS — BP 120/71 | HR 69 | Temp 98.5°F | Ht 76.0 in | Wt 339.0 lb

## 2018-03-17 DIAGNOSIS — Z6841 Body Mass Index (BMI) 40.0 and over, adult: Secondary | ICD-10-CM | POA: Diagnosis not present

## 2018-03-17 DIAGNOSIS — E559 Vitamin D deficiency, unspecified: Secondary | ICD-10-CM

## 2018-03-18 NOTE — Progress Notes (Signed)
Office: [phone]  /  Fax: [phone]   HPI:   Chief Complaint: OBESITY Timothy Ho is here to discuss his progress with his obesity treatment plan. He is on the keep a food journal with 550-700 calories and 50+ grams of protein at supper daily and follow the Category 4 plan + 100 calories and is following his eating plan approximately 95 % of the time. He states he is on the treadmill and using weight machines for 25-30 minutes 4 times per week. Timothy Ho did very well with weight loss. He finds the plan easy to follow. He has been using recipes that we gave him to keep his dinner more interesting.  His weight is (!) 339 lb (153.8 kg) today and has had a weight loss of 5 pounds over a period of 2 weeks since his last visit. He has lost 33 lbs since starting treatment with [place].  Vitamin D Deficiency Timothy Ho has a diagnosis of vitamin D deficiency. He is currently taking OTC Vit D and denies nausea, vomiting or muscle weakness.  ALLERGIES: No Known Allergies  MEDICATIONS: No current outpatient medications on file prior to visit.   No current facility-administered medications on file prior to visit.     PAST MEDICAL HISTORY: Past Medical History:  Diagnosis Date  . Chronic rhinitis   . H/O: pneumonia 2013  . Sleep apnea     PAST SURGICAL HISTORY: Past Surgical History:  Procedure Laterality Date  . COLONOSCOPY WITH PROPOFOL N/A 09/10/2017   Procedure: COLONOSCOPY WITH PROPOFOL;  Surgeon: Midge Minium, MD;  Location: [facility];  Service: Endoscopy;  Laterality: N/A;  . none      SOCIAL HISTORY: Social History   Tobacco Use  . Smoking status: Never Smoker  . Smokeless tobacco: Never Used  . Tobacco comment: Father smoked heavily  Substance Use Topics  . Alcohol use: Yes    Alcohol/week: 1.0 - 2.0 standard drinks    Types: 1 - 2 Cans of beer per week    Comment: Beer once or twice a week  . Drug use: No    FAMILY HISTORY: Family History  Problem Relation Age  of Onset  . Diabetes Mother   . Hypertension Mother   . Obesity Mother   . Crohn's disease Sister   . Lung disease Neg Hx   . Rheumatologic disease Neg Hx   . Cancer Neg Hx     ROS: Review of Systems  Constitutional: Positive for weight loss.  Gastrointestinal: Negative for nausea and vomiting.  Musculoskeletal:       Negative muscle weakness    PHYSICAL EXAM: Blood pressure 120/71, pulse 69, temperature 98.5 F (36.9 C), temperature source Oral, height 6\' 4"  (1.93 m), weight (!) 339 lb (153.8 kg), SpO2 97 %. Body mass index is 41.26 kg/m. Physical Exam Vitals signs reviewed.  Constitutional:      Appearance: Normal appearance. He is obese.  Cardiovascular:     Rate and Rhythm: Normal rate.     Pulses: Normal pulses.  Pulmonary:     Effort: Pulmonary effort is normal.     Breath sounds: Normal breath sounds.  Musculoskeletal: Normal range of motion.  Skin:    General: Skin is warm and dry.  Neurological:     Mental Status: He is alert and oriented to person, place, and time.  Psychiatric:        Mood and Affect: Mood normal.        Behavior: Behavior normal.  RECENT LABS AND TESTS: BMET    Component Value Date/Time   NA 142 01/11/2018 1258   K 4.2 01/11/2018 1258   CL 106 01/11/2018 1258   CO2 22 01/11/2018 1258   GLUCOSE 90 01/11/2018 1258   GLUCOSE 94 06/10/2017 1547   BUN 10 01/11/2018 1258   CREATININE 0.81 01/11/2018 1258   CALCIUM 9.1 01/11/2018 1258   GFRNONAA 101 01/11/2018 1258   GFRAA 116 01/11/2018 1258   Lab Results  Component Value Date   HGBA1C 5.6 01/11/2018   Lab Results  Component Value Date   INSULIN 15.2 01/11/2018   CBC    Component Value Date/Time   WBC 5.6 01/11/2018 1258   WBC 6.7 10/11/2015 1157   RBC 5.12 01/11/2018 1258   RBC 5.19 10/11/2015 1157   HGB 15.0 01/11/2018 1258   HCT 43.8 01/11/2018 1258   PLT 238.0 10/11/2015 1157   MCV 86 01/11/2018 1258   MCH 29.3 01/11/2018 1258   MCHC 34.2 01/11/2018 1258    MCHC 33.1 10/11/2015 1157   RDW 12.8 01/11/2018 1258   LYMPHSABS 1.7 01/11/2018 1258   MONOABS 0.6 10/11/2015 1157   EOSABS 0.1 01/11/2018 1258   BASOSABS 0.1 01/11/2018 1258   Iron/TIBC/Ferritin/ %Sat No results found for: IRON, TIBC, FERRITIN, IRONPCTSAT Lipid Panel     Component Value Date/Time   CHOL 174 01/11/2018 1258   TRIG 101 01/11/2018 1258   HDL 39 (L) 01/11/2018 1258   CHOLHDL 6 06/10/2017 1547   VLDL 31.2 06/10/2017 1547   LDLCALC 115 (H) 01/11/2018 1258   Hepatic Function Panel     Component Value Date/Time   PROT 7.0 01/11/2018 1258   ALBUMIN 4.4 01/11/2018 1258   AST 35 01/11/2018 1258   ALT 47 (H) 01/11/2018 1258   ALKPHOS 81 01/11/2018 1258   BILITOT 0.7 01/11/2018 1258      Component Value Date/Time   TSH 1.730 01/11/2018 1258    ASSESSMENT AND PLAN: Vitamin D deficiency  Class 3 severe obesity with serious comorbidity and body mass index (BMI) of 40.0 to 44.9 in adult, unspecified obesity type (HCC)  PLAN:  Vitamin D Deficiency Timothy Ho was informed that low vitamin D levels contributes to fatigue and are associated with obesity, breast, and colon cancer. Timothy Ho agrees to continue taking OTC Vit D and will follow up for routine testing of vitamin D, at least 2-3 times per year. He was informed of the risk of over-replacement of vitamin D and agrees to not increase his dose unless he discusses this with [place] first. Timothy Ho agrees to follow up with our clinic in 2 to 3 weeks.  I spent > than 50% of the 15 minute visit on counseling as documented in the note.  Obesity Timothy Ho is currently in the action stage of change. As such, his goal is to continue with weight loss efforts He has agreed to follow the Category 4 plan + 100 calories Timothy Ho has been instructed to work up to a goal of 150 minutes of combined cardio and strengthening exercise per week for weight loss and overall health benefits. We discussed the following Behavioral Modification Strategies  today: work on meal planning and easy cooking plans and ways to avoid boredom eating   Timothy Ho has agreed to follow up with our clinic in 2 to 3 weeks. He was informed of the importance of frequent follow up visits to maximize his success with intensive lifestyle modifications for his multiple health conditions.   OBESITY BEHAVIORAL INTERVENTION VISIT  Today's visit was # 5   Starting weight: 372 lbs Starting date: 01/11/2018 Today's weight : 339 lbs  Today's date: 03/17/2018 Total lbs lost to date: 33    03/17/2018  Height 6\' 4"  (1.93 m)  Weight 339 lb (153.8 kg) (A)  BMI (Calculated) 41.28  BLOOD PRESSURE - SYSTOLIC 120  BLOOD PRESSURE - DIASTOLIC 71   Body Fat % 39 %  Total Body Water (lbs) 147.6 lbs     ASK: We discussed the diagnosis of obesity with Timothy Ho today and Timothy Ho agreed to give us permission to discuss obesity behavioral modification therapy today.  ASSESS: Timothy Ho has the diagnosis of obesity and his BMI today is 41.28 Timothy Ho is in the action stage of change   ADVISE: Timothy Ho was educated on the multiple health risks of obesity as well as the benefit of weight loss to improve his health. He was advised of the need for long term treatment and the importance of lifestyle modifications.  AGREE: Multiple dietary modification options and treatment options were discussed and  Timothy Ho agreed to the above obesity treatment plan.  Timothy McburneyI, Sharon Martin, am acting as transcriptionist for Alois Clicheracey Damoni Erker, PA-C I, Alois Clicheracey Omauri Boeve, PA-C have reviewed above note and agree with its content

## 2018-03-25 ENCOUNTER — Encounter: Payer: Self-pay | Admitting: Family Medicine

## 2018-03-30 ENCOUNTER — Encounter (INDEPENDENT_AMBULATORY_CARE_PROVIDER_SITE_OTHER): Payer: Self-pay

## 2018-04-07 ENCOUNTER — Encounter (INDEPENDENT_AMBULATORY_CARE_PROVIDER_SITE_OTHER): Payer: Self-pay | Admitting: Physician Assistant

## 2018-04-07 ENCOUNTER — Ambulatory Visit (INDEPENDENT_AMBULATORY_CARE_PROVIDER_SITE_OTHER): Payer: 59 | Admitting: Physician Assistant

## 2018-04-07 ENCOUNTER — Other Ambulatory Visit: Payer: Self-pay

## 2018-04-07 DIAGNOSIS — E66813 Obesity, class 3: Secondary | ICD-10-CM

## 2018-04-07 DIAGNOSIS — E559 Vitamin D deficiency, unspecified: Secondary | ICD-10-CM

## 2018-04-07 DIAGNOSIS — E7849 Other hyperlipidemia: Secondary | ICD-10-CM

## 2018-04-07 DIAGNOSIS — Z6841 Body Mass Index (BMI) 40.0 and over, adult: Secondary | ICD-10-CM | POA: Diagnosis not present

## 2018-04-11 NOTE — Progress Notes (Signed)
Office: [phone]  /  Fax: [phone] TeleHealth Visit:  Timothy Ho has verbally consented to this TeleHealth visit today. The patient is located at home, the provider is located at the [facility] office. The participants in this visit include the listed provider and patient. The visit was conducted today via Skype.  HPI:   Chief Complaint: OBESITY Timothy Ho is here to discuss his progress with his obesity treatment plan. He is on the Category 4 plan + 100 calories and is following his eating plan approximately 95% of the time. He states he is exercising 0 minutes 0 times per week. Timothy Ho reports that he is enjoying the plan and continues to find it easy to follow. He states he cannot find the bread on the plan.  We were unable to weigh the patient today for this TeleHealth visit. He feels as if he has lost weight since his last visit. He has lost 33 lbs since starting treatment with [place].  Vitamin D deficiency Timothy Ho has a diagnosis of Vitamin D deficiency. He is currently taking prescription Vit D and denies nausea, vomiting or muscle weakness.  Hyperlipidemia Timothy Ho has hyperlipidemia and has been trying to improve his cholesterol levels with intensive lifestyle modification including a low saturated fat diet, exercise and weight loss. He is on no medication and denies any chest pain.  ASSESSMENT AND PLAN:  Vitamin D deficiency  Other hyperlipidemia  Class 3 severe obesity with serious comorbidity and body mass index (BMI) of 40.0 to 44.9 in adult, unspecified obesity type (HCC)  PLAN:  Vitamin D Deficiency Timothy Ho was informed that low Vitamin D levels contributes to fatigue and are associated with obesity, breast, and colon cancer. He agrees to continue to take prescription Vit D @ 50,000 IU every week #4 with 0 refills and will follow-up for routine testing of Vitamin D, at least 2-3 times per year. He was informed of the risk of over-replacement of Vitamin  D and agrees to not increase his dose unless he discusses this with [place] first. Timothy Ho agrees to follow-up with our clinic in 3 weeks.  Hyperlipidemia Timothy Ho was informed of the American Heart Association Guidelines emphasizing intensive lifestyle modifications as the first line treatment for hyperlipidemia. We discussed many lifestyle modifications today in depth, and Timothy Ho will continue to work on decreasing saturated fats such as fatty red meat, butter and many fried foods. He will also increase vegetables and lean protein in his diet and continue to work on exercise and weight loss efforts.  I spent > than 50% of the 25 minute visit on counseling as documented in the note.  Obesity Timothy Ho is currently in the action stage of change. As such, his goal is to continue with weight loss efforts. He has agreed to follow the Category 4 plan + 100 calories. Timothy Ho has been instructed to work up to a goal of 150 minutes of combined cardio and strengthening exercise per week for weight loss and overall health benefits. We discussed the following Behavioral Modification Strategies today: work on meal planning, easy cooking plans, and keeping healthy foods in the home.  Timothy Ho has agreed to follow-up with our clinic in 3 weeks. He was informed of the importance of frequent follow-up visits to maximize his success with intensive lifestyle modifications for his multiple health conditions.  ALLERGIES: No Known Allergies  MEDICATIONS: No current outpatient medications on file prior to visit.   No current facility-administered medications on file prior to visit.  PAST MEDICAL HISTORY: Past Medical History:  Diagnosis Date  . Chronic rhinitis   . H/O: pneumonia 2013  . Sleep apnea     PAST SURGICAL HISTORY: Past Surgical History:  Procedure Laterality Date  . COLONOSCOPY WITH PROPOFOL N/A 09/10/2017   Procedure: COLONOSCOPY WITH PROPOFOL;  Surgeon: Midge Minium, MD;  Location: [facility];  Service: Endoscopy;  Laterality: N/A;  . none      SOCIAL HISTORY: Social History   Tobacco Use  . Smoking status: Never Smoker  . Smokeless tobacco: Never Used  . Tobacco comment: Father smoked heavily  Substance Use Topics  . Alcohol use: Yes    Alcohol/week: 1.0 - 2.0 standard drinks    Types: 1 - 2 Cans of beer per week    Comment: Beer once or twice a week  . Drug use: No    FAMILY HISTORY: Family History  Problem Relation Age of Onset  . Diabetes Mother   . Hypertension Mother   . Obesity Mother   . Crohn's disease Sister   . Lung disease Neg Hx   . Rheumatologic disease Neg Hx   . Cancer Neg Hx    ROS: Review of Systems  Cardiovascular: Negative for chest pain.  Gastrointestinal: Negative for nausea and vomiting.  Musculoskeletal:       Negative for muscle weakness.   PHYSICAL EXAM: Pt in no acute distress  RECENT LABS AND TESTS: BMET    Component Value Date/Time   NA 142 01/11/2018 1258   K 4.2 01/11/2018 1258   CL 106 01/11/2018 1258   CO2 22 01/11/2018 1258   GLUCOSE 90 01/11/2018 1258   GLUCOSE 94 06/10/2017 1547   BUN 10 01/11/2018 1258   CREATININE 0.81 01/11/2018 1258   CALCIUM 9.1 01/11/2018 1258   GFRNONAA 101 01/11/2018 1258   GFRAA 116 01/11/2018 1258   Lab Results  Component Value Date   HGBA1C 5.6 01/11/2018   Lab Results  Component Value Date   INSULIN 15.2 01/11/2018   CBC    Component Value Date/Time   WBC 5.6 01/11/2018 1258   WBC 6.7 10/11/2015 1157   RBC 5.12 01/11/2018 1258   RBC 5.19 10/11/2015 1157   HGB 15.0 01/11/2018 1258   HCT 43.8 01/11/2018 1258   PLT 238.0 10/11/2015 1157   MCV 86 01/11/2018 1258   MCH 29.3 01/11/2018 1258   MCHC 34.2 01/11/2018 1258   MCHC 33.1 10/11/2015 1157   RDW 12.8 01/11/2018 1258   LYMPHSABS 1.7 01/11/2018 1258   MONOABS 0.6 10/11/2015 1157   EOSABS 0.1 01/11/2018 1258   BASOSABS 0.1 01/11/2018 1258   Iron/TIBC/Ferritin/ %Sat No results found for: IRON, TIBC,  FERRITIN, IRONPCTSAT Lipid Panel     Component Value Date/Time   CHOL 174 01/11/2018 1258   TRIG 101 01/11/2018 1258   HDL 39 (L) 01/11/2018 1258   CHOLHDL 6 06/10/2017 1547   VLDL 31.2 06/10/2017 1547   LDLCALC 115 (H) 01/11/2018 1258   Hepatic Function Panel     Component Value Date/Time   PROT 7.0 01/11/2018 1258   ALBUMIN 4.4 01/11/2018 1258   AST 35 01/11/2018 1258   ALT 47 (H) 01/11/2018 1258   ALKPHOS 81 01/11/2018 1258   BILITOT 0.7 01/11/2018 1258      Component Value Date/Time   TSH 1.730 01/11/2018 1258   Results for Pollok, JAYTHAN CRAKER (MRN [id number]) as of 04/11/2018 07:59  Ref. Range 01/11/2018 12:58  Vitamin D, 25-Hydroxy Latest Ref Range: 30.0 -  100.0 ng/mL 10.7 (L)    I, Marianna Paymentenise Haag, am acting as Energy managertranscriptionist for [employer] Corporationracey Leonard Hendler, PA-C Alois Clicheracey Persephonie Hegwood, PA-C have reviewed above note and agree with its content

## 2018-04-28 ENCOUNTER — Ambulatory Visit (INDEPENDENT_AMBULATORY_CARE_PROVIDER_SITE_OTHER): Payer: 59 | Admitting: Physician Assistant

## 2018-04-28 ENCOUNTER — Encounter (INDEPENDENT_AMBULATORY_CARE_PROVIDER_SITE_OTHER): Payer: Self-pay | Admitting: Physician Assistant

## 2018-04-28 ENCOUNTER — Other Ambulatory Visit: Payer: Self-pay

## 2018-04-28 DIAGNOSIS — E8881 Metabolic syndrome: Secondary | ICD-10-CM | POA: Diagnosis not present

## 2018-04-28 DIAGNOSIS — E66813 Obesity, class 3: Secondary | ICD-10-CM

## 2018-04-28 DIAGNOSIS — Z6841 Body Mass Index (BMI) 40.0 and over, adult: Secondary | ICD-10-CM

## 2018-04-28 DIAGNOSIS — E88819 Insulin resistance, unspecified: Secondary | ICD-10-CM

## 2018-04-28 NOTE — Progress Notes (Signed)
Office: [phone]  /  Fax: [phone] TeleHealth Visit:  Patricia NettleJoseph B Ho has verbally consented to this TeleHealth visit today. The patient is located at home, the provider is located at the [facility] office. The participants in this visit include the listed provider and patient. The visit was conducted today via Webex.  HPI:   Chief Complaint: OBESITY Timothy Ho is here to discuss his progress with his obesity treatment plan. He is on the Category 4 plan + 100 calories and is following his eating plan approximately 90 % of the time. He states he is walking 60 minutes 2 times per week. Timothy Ho reports his weight today is 329 pounds. He states that he has been hungrier, especially at night after dinner. He is enjoying the plan.  We were unable to weigh the patient today for this TeleHealth visit. He feels as if he has lost weight since his last visit. He has lost 33 lbs since starting treatment with us.  Insulin Resistance Timothy Ho has a diagnosis of insulin resistance based on his elevated fasting insulin level >5. Although Ziah's blood glucose readings are still under good control, insulin resistance puts him at greater risk of metabolic syndrome and diabetes. He is not taking medications currently and continues to work on diet and exercise to decrease risk of diabetes. Timothy Ho reports polyphagia.  ASSESSMENT AND PLAN:  Insulin resistance  Class 3 severe obesity with serious comorbidity and body mass index (BMI) of 40.0 to 44.9 in adult, unspecified obesity type (HCC)  PLAN:  Insulin Resistance Timothy Ho will continue to work on weight loss, exercise, and decreasing simple carbohydrates in his diet to help decrease the risk of diabetes. He was informed that eating too many simple carbohydrates or too many calories at one sitting increases the likelihood of GI side effects. Timothy Ho declined the offer for metformin at this time. Timothy Ho agreed to continue with weight loss and  to follow up with us as directed to monitor his progress in 3 weeks.  Obesity Timothy Ho is currently in the action stage of change. As such, his goal is to continue with weight loss efforts. He has agreed to follow the Category 4 plan + 200 calories. Timothy Ho has been instructed to work up to a goal of 150 minutes of combined cardio and strengthening exercise per week for weight loss and overall health benefits. We discussed the following Behavioral Modification Strategies today: work on meal planning and easy cooking plans and ways to avoid night time snacking.  Timothy Ho has agreed to follow up with our clinic in 3 weeks. He was informed of the importance of frequent follow up visits to maximize his success with intensive lifestyle modifications for his multiple health conditions.  ALLERGIES: No Known Allergies  MEDICATIONS: No current outpatient medications on file prior to visit.   No current facility-administered medications on file prior to visit.     PAST MEDICAL HISTORY: Past Medical History:  Diagnosis Date  . Chronic rhinitis   . H/O: pneumonia 2013  . Sleep apnea     PAST SURGICAL HISTORY: Past Surgical History:  Procedure Laterality Date  . COLONOSCOPY WITH PROPOFOL N/A 09/10/2017   Procedure: COLONOSCOPY WITH PROPOFOL;  Surgeon: Midge MiniumWohl, Darren, MD;  [facility];  Service: Endoscopy;  Laterality: N/A;  . none      SOCIAL HISTORY: Social History   Tobacco Use  . Smoking status: Never Smoker  . Smokeless tobacco: Never Used  . Tobacco comment: Father smoked heavily  Substance  Use Topics  . Alcohol use: Yes    Alcohol/week: 1.0 - 2.0 standard drinks    Types: 1 - 2 Cans of beer per week    Comment: Beer once or twice a week  . Drug use: No    FAMILY HISTORY: Family History  Problem Relation Age of Onset  . Diabetes Mother   . Hypertension Mother   . Obesity Mother   . Crohn's disease Sister   . Lung disease Neg Hx   . Rheumatologic disease Neg  Hx   . Cancer Neg Hx     ROS: Review of Systems  Endo/Heme/Allergies:       Positive for polyphagia.    PHYSICAL EXAM: Pt in no acute distress  RECENT LABS AND TESTS: BMET    Component Value Date/Time   NA 142 01/11/2018 1258   K 4.2 01/11/2018 1258   CL 106 01/11/2018 1258   CO2 22 01/11/2018 1258   GLUCOSE 90 01/11/2018 1258   GLUCOSE 94 06/10/2017 1547   BUN 10 01/11/2018 1258   CREATININE 0.81 01/11/2018 1258   CALCIUM 9.1 01/11/2018 1258   GFRNONAA 101 01/11/2018 1258   GFRAA 116 01/11/2018 1258   Lab Results  Component Value Date   HGBA1C 5.6 01/11/2018   Lab Results  Component Value Date   INSULIN 15.2 01/11/2018   CBC    Component Value Date/Time   WBC 5.6 01/11/2018 1258   WBC 6.7 10/11/2015 1157   RBC 5.12 01/11/2018 1258   RBC 5.19 10/11/2015 1157   HGB 15.0 01/11/2018 1258   HCT 43.8 01/11/2018 1258   PLT 238.0 10/11/2015 1157   MCV 86 01/11/2018 1258   MCH 29.3 01/11/2018 1258   MCHC 34.2 01/11/2018 1258   MCHC 33.1 10/11/2015 1157   RDW 12.8 01/11/2018 1258   LYMPHSABS 1.7 01/11/2018 1258   MONOABS 0.6 10/11/2015 1157   EOSABS 0.1 01/11/2018 1258   BASOSABS 0.1 01/11/2018 1258   Iron/TIBC/Ferritin/ %Sat No results found for: IRON, TIBC, FERRITIN, IRONPCTSAT Lipid Panel     Component Value Date/Time   CHOL 174 01/11/2018 1258   TRIG 101 01/11/2018 1258   HDL 39 (L) 01/11/2018 1258   CHOLHDL 6 06/10/2017 1547   VLDL 31.2 06/10/2017 1547   LDLCALC 115 (H) 01/11/2018 1258   Hepatic Function Panel     Component Value Date/Time   PROT 7.0 01/11/2018 1258   ALBUMIN 4.4 01/11/2018 1258   AST 35 01/11/2018 1258   ALT 47 (H) 01/11/2018 1258   ALKPHOS 81 01/11/2018 1258   BILITOT 0.7 01/11/2018 1258      Component Value Date/Time   TSH 1.730 01/11/2018 1258    Results for Ramey, TIGER KALBAUGH (MRN [id number]) as of 04/28/2018 10:48  Ref. Range 01/11/2018 12:58  Vitamin D, 25-Hydroxy Latest Ref Range: 30.0 - 100.0 ng/mL 10.7 (L)     I, Kirke Corin, CMA, am acting as transcriptionist for Alois Cliche, PA-C I, Alois Cliche, PA-C have reviewed above note and agree with its content

## 2018-05-10 DIAGNOSIS — G4733 Obstructive sleep apnea (adult) (pediatric): Secondary | ICD-10-CM | POA: Diagnosis not present

## 2018-05-16 ENCOUNTER — Encounter (INDEPENDENT_AMBULATORY_CARE_PROVIDER_SITE_OTHER): Payer: Self-pay | Admitting: Physician Assistant

## 2018-05-16 ENCOUNTER — Other Ambulatory Visit: Payer: Self-pay

## 2018-05-16 ENCOUNTER — Ambulatory Visit (INDEPENDENT_AMBULATORY_CARE_PROVIDER_SITE_OTHER): Payer: 59 | Admitting: Physician Assistant

## 2018-05-16 DIAGNOSIS — E7849 Other hyperlipidemia: Secondary | ICD-10-CM | POA: Diagnosis not present

## 2018-05-16 DIAGNOSIS — Z6841 Body Mass Index (BMI) 40.0 and over, adult: Secondary | ICD-10-CM

## 2018-05-16 DIAGNOSIS — E66813 Obesity, class 3: Secondary | ICD-10-CM

## 2018-05-17 NOTE — Progress Notes (Signed)
Office: [phone]  /  Fax: [phone] TeleHealth Visit:  Timothy Ho has verbally consented to this TeleHealth visit today. The patient is located at home, the provider is located at the [facility] office. The participants in this visit include the listed provider and patient. The visit was conducted today via Webex.  HPI:   Chief Complaint: OBESITY Timothy Ho is here to discuss his progress with his obesity treatment plan. He is on the Category 4 plan + 200 calories and is following his eating plan approximately 95% of the time. He states he is walking 60 minutes 2 times per week. Timothy Ho reports his weight to be 326 lbs today. He is doing very well on the plan overall. He has been drinking more water and notes his hunger is better controlled.  We were unable to weigh the patient today for this TeleHealth visit. He reports his weight to be 326 lbs today. He has lost 33 lbs since starting treatment with us.  Hyperlipidemia Timothy Ho has hyperlipidemia and has been trying to improve his cholesterol levels with intensive lifestyle modification including a low saturated fat diet, exercise and weight loss. He is on no medication and denies any chest pain.  ASSESSMENT AND PLAN:  Other hyperlipidemia  Class 3 severe obesity with serious comorbidity and body mass index (BMI) of 40.0 to 44.9 in adult, unspecified obesity type (HCC)  PLAN:  Hyperlipidemia Timothy Ho was informed of the American Heart Association Guidelines emphasizing intensive lifestyle modifications as the first line treatment for hyperlipidemia. We discussed many lifestyle modifications today in depth, and Timothy Ho will continue to work on decreasing saturated fats such as fatty red meat, butter and many fried foods. He will also increase vegetables and lean protein in his diet and continue to work on exercise and weight loss efforts.  Obesity Timothy Ho is currently in the action stage of change. As such, his goal  is to continue with weight loss efforts. He has agreed to follow the Category 4 plan + 200 calories. Timothy Ho has been instructed to continue his current exercise regimen and start resistance training for weight loss and overall health benefits. We discussed the following Behavioral Modification Strategies today: work on meal planning, easy cooking plans, and planning for success.  Timothy Ho has agreed to follow-up with our clinic in 3 weeks. He was informed of the importance of frequent follow-up visits to maximize his success with intensive lifestyle modifications for his multiple health conditions.  ALLERGIES: No Known Allergies  MEDICATIONS: No current outpatient medications on file prior to visit.   No current facility-administered medications on file prior to visit.     PAST MEDICAL HISTORY: Past Medical History:  Diagnosis Date  . Chronic rhinitis   . H/O: pneumonia 2013  . Sleep apnea     PAST SURGICAL HISTORY: Past Surgical History:  Procedure Laterality Date  . COLONOSCOPY WITH PROPOFOL N/A 09/10/2017   Procedure: COLONOSCOPY WITH PROPOFOL;  Surgeon: Midge MiniumWohl, Darren, MD;  [facility];  Service: Endoscopy;  Laterality: N/A;  . none      SOCIAL HISTORY: Social History   Tobacco Use  . Smoking status: Never Smoker  . Smokeless tobacco: Never Used  . Tobacco comment: Father smoked heavily  Substance Use Topics  . Alcohol use: Yes    Alcohol/week: 1.0 - 2.0 standard drinks    Types: 1 - 2 Cans of beer per week    Comment: Beer once or twice a week  . Drug use: No  FAMILY HISTORY: Family History  Problem Relation Age of Onset  . Diabetes Mother   . Hypertension Mother   . Obesity Mother   . Crohn's disease Sister   . Lung disease Neg Hx   . Rheumatologic disease Neg Hx   . Cancer Neg Hx    ROS: Review of Systems  Cardiovascular: Negative for chest pain.   PHYSICAL EXAM: Pt in no acute distress  RECENT LABS AND TESTS: BMET    Component  Value Date/Time   NA 142 01/11/2018 1258   K 4.2 01/11/2018 1258   CL 106 01/11/2018 1258   CO2 22 01/11/2018 1258   GLUCOSE 90 01/11/2018 1258   GLUCOSE 94 06/10/2017 1547   BUN 10 01/11/2018 1258   CREATININE 0.81 01/11/2018 1258   CALCIUM 9.1 01/11/2018 1258   GFRNONAA 101 01/11/2018 1258   GFRAA 116 01/11/2018 1258   Lab Results  Component Value Date   HGBA1C 5.6 01/11/2018   Lab Results  Component Value Date   INSULIN 15.2 01/11/2018   CBC    Component Value Date/Time   WBC 5.6 01/11/2018 1258   WBC 6.7 10/11/2015 1157   RBC 5.12 01/11/2018 1258   RBC 5.19 10/11/2015 1157   HGB 15.0 01/11/2018 1258   HCT 43.8 01/11/2018 1258   PLT 238.0 10/11/2015 1157   MCV 86 01/11/2018 1258   MCH 29.3 01/11/2018 1258   MCHC 34.2 01/11/2018 1258   MCHC 33.1 10/11/2015 1157   RDW 12.8 01/11/2018 1258   LYMPHSABS 1.7 01/11/2018 1258   MONOABS 0.6 10/11/2015 1157   EOSABS 0.1 01/11/2018 1258   BASOSABS 0.1 01/11/2018 1258   Iron/TIBC/Ferritin/ %Sat No results found for: IRON, TIBC, FERRITIN, IRONPCTSAT Lipid Panel     Component Value Date/Time   CHOL 174 01/11/2018 1258   TRIG 101 01/11/2018 1258   HDL 39 (L) 01/11/2018 1258   CHOLHDL 6 06/10/2017 1547   VLDL 31.2 06/10/2017 1547   LDLCALC 115 (H) 01/11/2018 1258   Hepatic Function Panel     Component Value Date/Time   PROT 7.0 01/11/2018 1258   ALBUMIN 4.4 01/11/2018 1258   AST 35 01/11/2018 1258   ALT 47 (H) 01/11/2018 1258   ALKPHOS 81 01/11/2018 1258   BILITOT 0.7 01/11/2018 1258      Component Value Date/Time   TSH 1.730 01/11/2018 1258   Results for Rounds, CHARLS GROWNEY (MRN [id number]) as of 05/17/2018 08:46  Ref. Range 01/11/2018 12:58  Vitamin D, 25-Hydroxy Latest Ref Range: 30.0 - 100.0 ng/mL 10.7 (L)   I, Timothy Ho, am acting as Energy manager for [employer], PA-C I, Alois Cliche, PA-C have reviewed above note and agree with its content

## 2018-06-02 ENCOUNTER — Ambulatory Visit (INDEPENDENT_AMBULATORY_CARE_PROVIDER_SITE_OTHER): Payer: Self-pay | Admitting: Family Medicine

## 2018-06-06 ENCOUNTER — Ambulatory Visit (INDEPENDENT_AMBULATORY_CARE_PROVIDER_SITE_OTHER): Payer: Self-pay | Admitting: Physician Assistant

## 2018-06-13 ENCOUNTER — Other Ambulatory Visit: Payer: Self-pay

## 2018-06-13 ENCOUNTER — Ambulatory Visit (INDEPENDENT_AMBULATORY_CARE_PROVIDER_SITE_OTHER): Payer: 59 | Admitting: Physician Assistant

## 2018-06-13 ENCOUNTER — Encounter (INDEPENDENT_AMBULATORY_CARE_PROVIDER_SITE_OTHER): Payer: Self-pay | Admitting: Physician Assistant

## 2018-06-13 DIAGNOSIS — E8881 Metabolic syndrome: Secondary | ICD-10-CM

## 2018-06-13 DIAGNOSIS — Z6841 Body Mass Index (BMI) 40.0 and over, adult: Secondary | ICD-10-CM | POA: Diagnosis not present

## 2018-06-14 NOTE — Progress Notes (Signed)
Office: [phone]  /  Fax: [phone] TeleHealth Visit:  Timothy Ho has verbally consented to this TeleHealth visit today. The patient is located at home, the provider is located at the [facility]. The participants in this visit include the listed provider and patient. Timothy Ho was unable to use Skype today and the telehealth visit was conducted via telephone.  HPI:   Chief Complaint: OBESITY Timothy Ho is here to discuss his progress with his obesity treatment plan. He is on the Category 4 plan + 200 calories and is following his eating plan approximately 90 to 95 % of the time. He states he is walking 60 minutes 2 times per week. Timothy Ho reports doing well on the plan. He reports his weight to be 319 lbs today. He is getting bored with breakfast and wants more options.  We were unable to weigh the patient today for this TeleHealth visit. He feels as if he has lost weight since his last visit. He has lost 33 lbs since starting treatment with us.  Insulin Resistance Timothy Ho has a diagnosis of insulin resistance based on his elevated fasting insulin level >5. Although Timothy Ho's blood glucose readings are still under good control, insulin resistance puts him at greater risk of metabolic syndrome and diabetes. He is not taking medications currently and continues to work on diet and exercise to decrease risk of diabetes. Timothy Ho denies polyphagia.  ASSESSMENT AND PLAN:  Insulin resistance  Class 3 severe obesity with serious comorbidity and body mass index (BMI) of 40.0 to 44.9 in adult, unspecified obesity type (HCC)  PLAN:  Insulin Resistance Timothy Ho will continue to work on weight loss, exercise, and decreasing simple carbohydrates in his diet to help decrease the risk of diabetes. He was informed that eating too many simple carbohydrates or too many calories at one sitting increases the likelihood of GI side effects. Timothy Ho agreed to continue with weight loss and to  follow up with us as directed to monitor his progress in 2 weeks.  Obesity Timothy Ho is currently in the action stage of change. As such, his goal is to continue with weight loss efforts. He has agreed to follow the Category 4 plan. Timothy Ho has been instructed to work up to a goal of 150 minutes of combined cardio and strengthening exercise per week for weight loss and overall health benefits. We discussed the following Behavioral Modification Strategies today: work on meal planning and easy cooking plans and ways to avoid boredom eating.  Timothy Ho has agreed to follow up with our clinic in 2 weeks. He was informed of the importance of frequent follow up visits to maximize his success with intensive lifestyle modifications for his multiple health conditions.  ALLERGIES: No Known Allergies  MEDICATIONS: No current outpatient medications on file prior to visit.   No current facility-administered medications on file prior to visit.     PAST MEDICAL HISTORY: Past Medical History:  Diagnosis Date  . Chronic rhinitis   . H/O: pneumonia 2013  . Sleep apnea     PAST SURGICAL HISTORY: Past Surgical History:  Procedure Laterality Date  . COLONOSCOPY WITH PROPOFOL N/A 09/10/2017   Procedure: COLONOSCOPY WITH PROPOFOL;  Surgeon: Midge MiniumWohl, Darren, MD;  [facility];  Service: Endoscopy;  Laterality: N/A;  . none      SOCIAL HISTORY: Social History   Tobacco Use  . Smoking status: Never Smoker  . Smokeless tobacco: Never Used  . Tobacco comment: Father smoked heavily  Substance Use Topics  .  Alcohol use: Yes    Alcohol/week: 1.0 - 2.0 standard drinks    Types: 1 - 2 Cans of beer per week    Comment: Beer once or twice a week  . Drug use: No    FAMILY HISTORY: Family History  Problem Relation Age of Onset  . Diabetes Mother   . Hypertension Mother   . Obesity Mother   . Crohn's disease Sister   . Lung disease Neg Hx   . Rheumatologic disease Neg Hx   . Cancer Neg Hx      ROS: Review of Systems  Endo/Heme/Allergies:       Negative for polyphagia.    PHYSICAL EXAM: Pt in no acute distress  RECENT LABS AND TESTS: BMET    Component Value Date/Time   NA 142 01/11/2018 1258   K 4.2 01/11/2018 1258   CL 106 01/11/2018 1258   CO2 22 01/11/2018 1258   GLUCOSE 90 01/11/2018 1258   GLUCOSE 94 06/10/2017 1547   BUN 10 01/11/2018 1258   CREATININE 0.81 01/11/2018 1258   CALCIUM 9.1 01/11/2018 1258   GFRNONAA 101 01/11/2018 1258   GFRAA 116 01/11/2018 1258   Lab Results  Component Value Date   HGBA1C 5.6 01/11/2018   Lab Results  Component Value Date   INSULIN 15.2 01/11/2018   CBC    Component Value Date/Time   WBC 5.6 01/11/2018 1258   WBC 6.7 10/11/2015 1157   RBC 5.12 01/11/2018 1258   RBC 5.19 10/11/2015 1157   HGB 15.0 01/11/2018 1258   HCT 43.8 01/11/2018 1258   PLT 238.0 10/11/2015 1157   MCV 86 01/11/2018 1258   MCH 29.3 01/11/2018 1258   MCHC 34.2 01/11/2018 1258   MCHC 33.1 10/11/2015 1157   RDW 12.8 01/11/2018 1258   LYMPHSABS 1.7 01/11/2018 1258   MONOABS 0.6 10/11/2015 1157   EOSABS 0.1 01/11/2018 1258   BASOSABS 0.1 01/11/2018 1258   Iron/TIBC/Ferritin/ %Sat No results found for: IRON, TIBC, FERRITIN, IRONPCTSAT Lipid Panel     Component Value Date/Time   CHOL 174 01/11/2018 1258   TRIG 101 01/11/2018 1258   HDL 39 (L) 01/11/2018 1258   CHOLHDL 6 06/10/2017 1547   VLDL 31.2 06/10/2017 1547   LDLCALC 115 (H) 01/11/2018 1258   Hepatic Function Panel     Component Value Date/Time   PROT 7.0 01/11/2018 1258   ALBUMIN 4.4 01/11/2018 1258   AST 35 01/11/2018 1258   ALT 47 (H) 01/11/2018 1258   ALKPHOS 81 01/11/2018 1258   BILITOT 0.7 01/11/2018 1258      Component Value Date/Time   TSH 1.730 01/11/2018 1258   Results for Netzer, CHRISTROPHER GINTZ (MRN [id number]) as of 06/14/2018 13:29  Ref. Range 01/11/2018 12:58  Vitamin D, 25-Hydroxy Latest Ref Range: 30.0 - 100.0 ng/mL 10.7 (L)     I, Marcille Blanco, CMA, am  acting as transcriptionist for Abby Potash, PA-C I, Abby Potash, PA-C have reviewed above note and agree with its content

## 2018-07-13 ENCOUNTER — Encounter (INDEPENDENT_AMBULATORY_CARE_PROVIDER_SITE_OTHER): Payer: Self-pay | Admitting: Physician Assistant

## 2018-07-13 ENCOUNTER — Ambulatory Visit (INDEPENDENT_AMBULATORY_CARE_PROVIDER_SITE_OTHER): Payer: 59 | Admitting: Physician Assistant

## 2018-07-13 ENCOUNTER — Other Ambulatory Visit: Payer: Self-pay

## 2018-07-13 VITALS — BP 127/77 | HR 91 | Temp 98.0°F | Ht 76.0 in | Wt 321.0 lb

## 2018-07-13 DIAGNOSIS — E8881 Metabolic syndrome: Secondary | ICD-10-CM

## 2018-07-13 DIAGNOSIS — Z6839 Body mass index (BMI) 39.0-39.9, adult: Secondary | ICD-10-CM | POA: Diagnosis not present

## 2018-07-13 NOTE — Progress Notes (Signed)
Office: [phone]  /  Fax: [phone]   HPI:   Chief Complaint: OBESITY Jomarie LongsJoseph is here to discuss his progress with his obesity treatment plan. He is on the Category 4 plan and is following his eating plan approximately 90% of the time. He states he is riding a bicycle 25 minutes 3 times per week. Jomarie LongsJoseph reports doing well on the plan. He is tired of vegetables and has not been eating them regularly.  His weight is (!) 321 lb (145.6 kg) today and has had a weight loss of 18 pounds over a period of 4 months since his last in-office visit. He has lost 51 lbs since starting treatment with us.  Insulin Resistance Jomarie LongsJoseph has a diagnosis of insulin resistance based on his elevated fasting insulin level >5. Although Altariq's blood glucose readings are still under good control, insulin resistance puts him at greater risk of metabolic syndrome and diabetes. He is on no medications currently and continues to work on diet and exercise to decrease risk of diabetes. No polyphagia.  ASSESSMENT AND PLAN:  Insulin resistance  Class 2 severe obesity with serious comorbidity and body mass index (BMI) of 39.0 to 39.9 in adult, unspecified obesity type (HCC)  PLAN:  Insulin Resistance Jomarie LongsJoseph will continue to work on weight loss, exercise, and decreasing simple carbohydrates in his diet to help decrease the risk of diabetes. We dicussed metformin including benefits and risks. He was informed that eating too many simple carbohydrates or too many calories at one sitting increases the likelihood of GI side effects. Jomarie LongsJoseph will continue with weight loss and follow-up with us as directed to monitor his progress.  I spent > than 50% of the 15 minute visit on counseling as documented in the note.  Obesity Jomarie LongsJoseph is currently in the action stage of change. As such, his goal is to continue with weight loss efforts. He has agreed to follow the Category 4 plan. Jomarie LongsJoseph has been instructed to work up to a goal  of 150 minutes of combined cardio and strengthening exercise per week for weight loss and overall health benefits. We discussed the following Behavioral Modification Strategies today: work on meal planning, easy cooking plans, and keeping healthy foods in the home.  Jomarie LongsJoseph has agreed to follow-up with our clinic in 3-4 weeks. He was informed of the importance of frequent follow-up visits to maximize his success with intensive lifestyle modifications for his multiple health conditions.  ALLERGIES: No Known Allergies  MEDICATIONS: No current outpatient medications on file prior to visit.   No current facility-administered medications on file prior to visit.     PAST MEDICAL HISTORY: Past Medical History:  Diagnosis Date   Chronic rhinitis    H/O: pneumonia 2013   Sleep apnea     PAST SURGICAL HISTORY: Past Surgical History:  Procedure Laterality Date   COLONOSCOPY WITH PROPOFOL N/A 09/10/2017   Procedure: COLONOSCOPY WITH PROPOFOL;  Surgeon: Midge MiniumWohl, Darren, MD;  [facility];  Service: Endoscopy;  Laterality: N/A;   none      SOCIAL HISTORY: Social History   Tobacco Use   Smoking status: Never Smoker   Smokeless tobacco: Never Used   Tobacco comment: Father smoked heavily  Substance Use Topics   Alcohol use: Yes    Alcohol/week: 1.0 - 2.0 standard drinks    Types: 1 - 2 Cans of beer per week    Comment: Beer once or twice a week   Drug use: No    FAMILY HISTORY:  Family History  Problem Relation Age of Onset   Diabetes Mother    Hypertension Mother    Obesity Mother    Crohn's disease Sister    Lung disease Neg Hx    Rheumatologic disease Neg Hx    Cancer Neg Hx    ROS: Review of Systems  Endo/Heme/Allergies:       Negative for polyphagia.   PHYSICAL EXAM: Blood pressure 127/77, pulse 91, temperature 98 F (36.7 C), temperature source Oral, height 6\' 4"  (1.93 m), weight (!) 321 lb (145.6 kg), SpO2 95 %. Body mass index is  39.07 kg/m. Physical Exam Vitals signs reviewed.  Constitutional:      Appearance: Normal appearance. He is obese.  Cardiovascular:     Rate and Rhythm: Normal rate.     Pulses: Normal pulses.  Pulmonary:     Effort: Pulmonary effort is normal.     Breath sounds: Normal breath sounds.  Musculoskeletal: Normal range of motion.  Skin:    General: Skin is warm and dry.  Neurological:     Mental Status: He is alert and oriented to person, place, and time.  Psychiatric:        Behavior: Behavior normal.   RECENT LABS AND TESTS: BMET    Component Value Date/Time   NA 142 01/11/2018 1258   K 4.2 01/11/2018 1258   CL 106 01/11/2018 1258   CO2 22 01/11/2018 1258   GLUCOSE 90 01/11/2018 1258   GLUCOSE 94 06/10/2017 1547   BUN 10 01/11/2018 1258   CREATININE 0.81 01/11/2018 1258   CALCIUM 9.1 01/11/2018 1258   GFRNONAA 101 01/11/2018 1258   GFRAA 116 01/11/2018 1258   Lab Results  Component Value Date   HGBA1C 5.6 01/11/2018   Lab Results  Component Value Date   INSULIN 15.2 01/11/2018   CBC    Component Value Date/Time   WBC 5.6 01/11/2018 1258   WBC 6.7 10/11/2015 1157   RBC 5.12 01/11/2018 1258   RBC 5.19 10/11/2015 1157   HGB 15.0 01/11/2018 1258   HCT 43.8 01/11/2018 1258   PLT 238.0 10/11/2015 1157   MCV 86 01/11/2018 1258   MCH 29.3 01/11/2018 1258   MCHC 34.2 01/11/2018 1258   MCHC 33.1 10/11/2015 1157   RDW 12.8 01/11/2018 1258   LYMPHSABS 1.7 01/11/2018 1258   MONOABS 0.6 10/11/2015 1157   EOSABS 0.1 01/11/2018 1258   BASOSABS 0.1 01/11/2018 1258   Iron/TIBC/Ferritin/ %Sat No results found for: IRON, TIBC, FERRITIN, IRONPCTSAT Lipid Panel     Component Value Date/Time   CHOL 174 01/11/2018 1258   TRIG 101 01/11/2018 1258   HDL 39 (L) 01/11/2018 1258   CHOLHDL 6 06/10/2017 1547   VLDL 31.2 06/10/2017 1547   LDLCALC 115 (H) 01/11/2018 1258   Hepatic Function Panel     Component Value Date/Time   PROT 7.0 01/11/2018 1258   ALBUMIN 4.4  01/11/2018 1258   AST 35 01/11/2018 1258   ALT 47 (H) 01/11/2018 1258   ALKPHOS 81 01/11/2018 1258   BILITOT 0.7 01/11/2018 1258      Component Value Date/Time   TSH 1.730 01/11/2018 1258   Results for Ting, Landis GandyJOSEPH B (MRN [id number]) as of 07/13/2018 17:07  Ref. Range 01/11/2018 12:58  Vitamin D, 25-Hydroxy Latest Ref Range: 30.0 - 100.0 ng/mL 10.7 (L)   OBESITY BEHAVIORAL INTERVENTION VISIT  Today's visit was #10   Starting weight: 372 lbs Starting date: 01/11/2018 Today's weight: 321 lbs  Today's date: 07/13/2018 Total  lbs lost to date: 51   07/13/2018  Height 6\' 4"  (1.93 m)  Weight 321 lb (145.6 kg) (A)  BMI (Calculated) 39.09  BLOOD PRESSURE - SYSTOLIC 242  BLOOD PRESSURE - DIASTOLIC 77   Body Fat % 35.3 %  Total Body Water (lbs) 148.4 lbs   ASK: We discussed the diagnosis of obesity with Kelly Splinter today and Flay agreed to give [place] permission to discuss obesity behavioral modification therapy today.  ASSESS: Casen has the diagnosis of obesity and his BMI today is 39.1. Verlyn is in the action stage of change.   ADVISE: Ettore was educated on the multiple health risks of obesity as well as the benefit of weight loss to improve his health. He was advised of the need for long term treatment and the importance of lifestyle modifications to improve his current health and to decrease his risk of future health problems.  AGREE: Multiple dietary modification options and treatment options were discussed and  Kalem agreed to follow the recommendations documented in the above note.  ARRANGE: Joshual was educated on the importance of frequent visits to treat obesity as outlined per CMS and USPSTF guidelines and agreed to schedule his next follow up appointment today.  Migdalia Dk, am acting as transcriptionist for Abby Potash, PA-C I, Abby Potash, PA-C have reviewed above note and agree with its content

## 2018-08-09 ENCOUNTER — Ambulatory Visit (INDEPENDENT_AMBULATORY_CARE_PROVIDER_SITE_OTHER): Payer: 59 | Admitting: Physician Assistant

## 2018-08-09 DIAGNOSIS — G4733 Obstructive sleep apnea (adult) (pediatric): Secondary | ICD-10-CM | POA: Diagnosis not present

## 2018-08-18 ENCOUNTER — Other Ambulatory Visit: Payer: Self-pay

## 2018-08-18 ENCOUNTER — Ambulatory Visit (INDEPENDENT_AMBULATORY_CARE_PROVIDER_SITE_OTHER): Payer: 59 | Admitting: Physician Assistant

## 2018-08-18 ENCOUNTER — Encounter (INDEPENDENT_AMBULATORY_CARE_PROVIDER_SITE_OTHER): Payer: Self-pay | Admitting: Physician Assistant

## 2018-08-18 VITALS — BP 137/74 | HR 75 | Temp 98.5°F | Ht 76.0 in | Wt 321.0 lb

## 2018-08-18 DIAGNOSIS — E8881 Metabolic syndrome: Secondary | ICD-10-CM | POA: Diagnosis not present

## 2018-08-18 DIAGNOSIS — E7849 Other hyperlipidemia: Secondary | ICD-10-CM

## 2018-08-18 DIAGNOSIS — Z9189 Other specified personal risk factors, not elsewhere classified: Secondary | ICD-10-CM

## 2018-08-18 DIAGNOSIS — E559 Vitamin D deficiency, unspecified: Secondary | ICD-10-CM | POA: Diagnosis not present

## 2018-08-18 DIAGNOSIS — Z6839 Body mass index (BMI) 39.0-39.9, adult: Secondary | ICD-10-CM

## 2018-08-22 ENCOUNTER — Other Ambulatory Visit: Payer: Self-pay

## 2018-08-22 NOTE — Progress Notes (Signed)
Office: [phone]  /  Fax: [phone]   HPI:   Chief Complaint: OBESITY Timothy Ho is here to discuss his progress with his obesity treatment plan. He is on the Category 4 plan and is following his eating plan approximately 90% of the time. He states he is exercising 0 minutes 0 times per week. Timothy Ho reports that his hunger is controlled. He has not been walking as he normally does and reports he is still struggling to eat vegetables and salads.  His weight is (!) 321 lb (145.6 kg) today and has not lost weight since his last visit. He has lost 51 lbs since starting treatment with [place].  Hyperlipidemia Durelle has hyperlipidemia and has been trying to improve his cholesterol levels with intensive lifestyle modification including a low saturated fat diet, exercise and weight loss. He is on no medication and denies any chest pain.  Insulin Resistance Timothy Ho has a diagnosis of insulin resistance based on his elevated fasting insulin level >5. Although Dontario's blood glucose readings are still under good control, insulin resistance puts him at greater risk of metabolic syndrome and diabetes. He is on no medications currently and continues to work on diet and exercise to decrease risk of diabetes. No polyphagia.  At risk for diabetes Timothy Ho is at higher than average risk for developing diabetes due to his obesity. He currently denies polyuria or polydipsia.  Vitamin D deficiency Timothy Ho has a diagnosis of Vitamin D deficiency. He is currently not taking Vit D and denies nausea, vomiting or muscle weakness.  ASSESSMENT AND PLAN:  Insulin resistance - Plan: Comprehensive metabolic panel, Hemoglobin A1c, Insulin, random, VITAMIN D 25 Hydroxy (Vit-D Deficiency, Fractures)  Other hyperlipidemia - Plan: Lipid Panel With LDL/HDL Ratio  Vitamin D deficiency  At risk for diabetes mellitus  Class 2 severe obesity with serious comorbidity and body mass index (BMI) of 39.0 to 39.9 in adult,  unspecified obesity type ([place])  PLAN:  Hyperlipidemia Timothy Ho was informed of the American Heart Association Guidelines emphasizing intensive lifestyle modifications as the first line treatment for hyperlipidemia. We discussed many lifestyle modifications today in depth, and Dryden will continue to work on decreasing saturated fats such as fatty red meat, butter and many fried foods. He will have labs checked, increase vegetables and lean protein in his diet, and continue to work on exercise and weight loss efforts.  Insulin Resistance Timothy Ho will continue to work on weight loss, exercise, and decreasing simple carbohydrates in his diet to help decrease the risk of diabetes. We dicussed metformin including benefits and risks. He was informed that eating too many simple carbohydrates or too many calories at one sitting increases the likelihood of GI side effects. Kam will have labs checked and continue with weight loss. He will follow-up with [place] as directed to monitor his progress.  Diabetes risk counseling Timothy Ho was given extended (15 minutes) diabetes prevention counseling today. He is 53 y.o. male and has risk factors for diabetes including obesity. We discussed intensive lifestyle modifications today with an emphasis on weight loss as well as increasing exercise and decreasing simple carbohydrates in his diet.  Vitamin D Deficiency Timothy Ho was informed that low Vitamin D levels contributes to fatigue and are associated with obesity, breast, and colon cancer. He will have routine testing of Vitamin D and agrees to follow-up with our clinic in 4 weeks.  Obesity Timothy Ho is currently in the action stage of change. As such, his goal is to continue with weight loss efforts. He has agreed  to follow the Category 4 plan + 100-200 protein calories. Timothy Ho has been instructed to work up to a goal of 150 minutes of combined cardio and strengthening exercise per week for weight loss and overall health  benefits. We discussed the following Behavioral Modification Strategies today: increasing lean protein intake, work on meal planning and easy cooking plans.  Timothy Ho has agreed to follow-up with our clinic in 4 weeks. He was informed of the importance of frequent follow-up visits to maximize his success with intensive lifestyle modifications for his multiple health conditions.  ALLERGIES: No Known Allergies  MEDICATIONS: No current outpatient medications on file prior to visit.   No current facility-administered medications on file prior to visit.     PAST MEDICAL HISTORY: Past Medical History:  Diagnosis Date   Chronic rhinitis    H/O: pneumonia 2013   Sleep apnea     PAST SURGICAL HISTORY: Past Surgical History:  Procedure Laterality Date   COLONOSCOPY WITH PROPOFOL N/A 09/10/2017   Procedure: COLONOSCOPY WITH PROPOFOL;  Surgeon: Midge MiniumWohl, Darren, MD;  [facility];  Service: Endoscopy;  Laterality: N/A;   none      SOCIAL HISTORY: Social History   Tobacco Use   Smoking status: Never Smoker   Smokeless tobacco: Never Used   Tobacco comment: Father smoked heavily  Substance Use Topics   Alcohol use: Yes    Alcohol/week: 1.0 - 2.0 standard drinks    Types: 1 - 2 Cans of beer per week    Comment: Beer once or twice a week   Drug use: No    FAMILY HISTORY: Family History  Problem Relation Age of Onset   Diabetes Mother    Hypertension Mother    Obesity Mother    Crohn's disease Sister    Lung disease Neg Hx    Rheumatologic disease Neg Hx    Cancer Neg Hx    ROS: Review of Systems  Cardiovascular: Negative for chest pain.  Gastrointestinal: Negative for nausea and vomiting.  Musculoskeletal:       Negative for muscle weakness.  Endo/Heme/Allergies:       Negative for polyphagia.   PHYSICAL EXAM: Blood pressure 137/74, pulse 75, temperature 98.5 F (36.9 C), temperature source Oral, height 6\' 4"  (1.93 m), weight (!) 321 lb  (145.6 kg), SpO2 95 %. Body mass index is 39.07 kg/m. Physical Exam Vitals signs reviewed.  Constitutional:      Appearance: Normal appearance. He is obese.  Cardiovascular:     Rate and Rhythm: Normal rate.     Pulses: Normal pulses.  Pulmonary:     Effort: Pulmonary effort is normal.     Breath sounds: Normal breath sounds.  Musculoskeletal: Normal range of motion.  Skin:    General: Skin is warm and dry.  Neurological:     Mental Status: He is alert and oriented to person, place, and time.  Psychiatric:        Behavior: Behavior normal.   RECENT LABS AND TESTS: BMET    Component Value Date/Time   NA 142 01/11/2018 1258   K 4.2 01/11/2018 1258   CL 106 01/11/2018 1258   CO2 22 01/11/2018 1258   GLUCOSE 90 01/11/2018 1258   GLUCOSE 94 06/10/2017 1547   BUN 10 01/11/2018 1258   CREATININE 0.81 01/11/2018 1258   CALCIUM 9.1 01/11/2018 1258   GFRNONAA 101 01/11/2018 1258   GFRAA 116 01/11/2018 1258   Lab Results  Component Value Date   HGBA1C 5.6 01/11/2018  Lab Results  Component Value Date   INSULIN 15.2 01/11/2018   CBC    Component Value Date/Time   WBC 5.6 01/11/2018 1258   WBC 6.7 10/11/2015 1157   RBC 5.12 01/11/2018 1258   RBC 5.19 10/11/2015 1157   HGB 15.0 01/11/2018 1258   HCT 43.8 01/11/2018 1258   PLT 238.0 10/11/2015 1157   MCV 86 01/11/2018 1258   MCH 29.3 01/11/2018 1258   MCHC 34.2 01/11/2018 1258   MCHC 33.1 10/11/2015 1157   RDW 12.8 01/11/2018 1258   LYMPHSABS 1.7 01/11/2018 1258   MONOABS 0.6 10/11/2015 1157   EOSABS 0.1 01/11/2018 1258   BASOSABS 0.1 01/11/2018 1258   Iron/TIBC/Ferritin/ %Sat No results found for: IRON, TIBC, FERRITIN, IRONPCTSAT Lipid Panel     Component Value Date/Time   CHOL 174 01/11/2018 1258   TRIG 101 01/11/2018 1258   HDL 39 (L) 01/11/2018 1258   CHOLHDL 6 06/10/2017 1547   VLDL 31.2 06/10/2017 1547   LDLCALC 115 (H) 01/11/2018 1258   Hepatic Function Panel     Component Value Date/Time    PROT 7.0 01/11/2018 1258   ALBUMIN 4.4 01/11/2018 1258   AST 35 01/11/2018 1258   ALT 47 (H) 01/11/2018 1258   ALKPHOS 81 01/11/2018 1258   BILITOT 0.7 01/11/2018 1258      Component Value Date/Time   TSH 1.730 01/11/2018 1258   Results for Osowski, Landis GandyJOSEPH B (MRN [id number]) as of 08/22/2018 11:47  Ref. Range 01/11/2018 12:58  Vitamin D, 25-Hydroxy Latest Ref Range: 30.0 - 100.0 ng/mL 10.7 (L)   OBESITY BEHAVIORAL INTERVENTION VISIT  Today's visit was #11  Starting weight: 372 lbs Starting date: 01/11/2018 Today's weight: 321 lbs  Today's date: 08/18/2018 Total lbs lost to date: 51    08/18/2018  Height 6\' 4"  (1.93 m)  Weight 321 lb (145.6 kg) (A)  BMI (Calculated) 39.09  BLOOD PRESSURE - SYSTOLIC 137  BLOOD PRESSURE - DIASTOLIC 74   Body Fat % 38.1 %  Total Body Water (lbs) 143.2 lbs   ASK: We discussed the diagnosis of obesity with Patricia NettleJoseph B Jacome today and Timothy Ho agreed to give us permission to discuss obesity behavioral modification therapy today.  ASSESS: Timothy Ho has the diagnosis of obesity and his BMI today is 39.1. Timothy Ho is in the action stage of change.   ADVISE: Timothy Ho was educated on the multiple health risks of obesity as well as the benefit of weight loss to improve his health. He was advised of the need for long term treatment and the importance of lifestyle modifications to improve his current health and to decrease his risk of future health problems.  AGREE: Multiple dietary modification options and treatment options were discussed and  Timothy Ho agreed to follow the recommendations documented in the above note.  ARRANGE: Timothy Ho was educated on the importance of frequent visits to treat obesity as outlined per CMS and USPSTF guidelines and agreed to schedule his next follow up appointment today.  Fernanda DrumI, Denise Haag, am acting as transcriptionist for Alois Clicheracey Aguilar, PA-C I, Alois Clicheracey Aguilar, PA-C have reviewed above note and agree with its content

## 2018-08-23 ENCOUNTER — Encounter: Payer: Self-pay | Admitting: Family Medicine

## 2018-08-23 ENCOUNTER — Ambulatory Visit (INDEPENDENT_AMBULATORY_CARE_PROVIDER_SITE_OTHER): Payer: 59 | Admitting: Family Medicine

## 2018-08-23 VITALS — BP 120/80 | HR 64 | Temp 98.2°F | Ht 77.0 in | Wt 329.5 lb

## 2018-08-23 DIAGNOSIS — Z0000 Encounter for general adult medical examination without abnormal findings: Secondary | ICD-10-CM

## 2018-08-23 NOTE — Patient Instructions (Addendum)
Give [place] 2-3 business days to get the results of your labs back.   Keep the diet clean and stay active.  Aim to do some physical exertion for 150 minutes per week. This is typically divided into 5 days per week, 30 minutes per day. The activity should be enough to get your heart rate up. Anything is better than nothing if you have time constraints.  The new Shingrix vaccine (for shingles) is a 2 shot series. It can make people feel low energy, achy and almost like they have the flu for 48 hours after injection. Please plan accordingly when deciding on when to get this shot. Call our office for a nurse visit appointment to get this. The second shot of the series is less severe regarding the side effects, but it still lasts 48 hours.   I recommend getting the flu shot in mid October. This suggestion would change if the CDC comes out with a different recommendation.   Let us know if you need anything.

## 2018-08-23 NOTE — Progress Notes (Signed)
Chief Complaint  Patient presents with  . Annual Exam    Well Male Timothy Ho is here for a complete physical.   His last physical was >1 year ago.  Current diet: in general, a "healthy" diet.  Current exercise: walking Weight trend: continues to lose wt  Daytime fatigue? No. Seat belt? Yes.    Health maintenance Shingrix- No Colonoscopy- Yes Tetanus- Yes HIV- Yes Hep C- Yes   Past Medical History:  Diagnosis Date  . Chronic rhinitis   . H/O: pneumonia 2013  . Sleep apnea       Past Surgical History:  Procedure Laterality Date  . COLONOSCOPY WITH PROPOFOL N/A 09/10/2017   Procedure: COLONOSCOPY WITH PROPOFOL;  Surgeon: Lucilla Lame, MD;  Location: [facility];  Service: Endoscopy;  Laterality: N/A;  . none      Medications  Takes no meds routinely.   Allergies No Known Allergies  Family History Family History  Problem Relation Age of Onset  . Diabetes Mother   . Hypertension Mother   . Obesity Mother   . Crohn's disease Sister   . Lung disease Neg Hx   . Rheumatologic disease Neg Hx   . Cancer Neg Hx     Review of Systems: Constitutional:  no fevers Eye:  no recent significant change in vision Ear/Nose/Mouth/Throat:  Ears:  no hearing loss Nose/Mouth/Throat:  no complaints of nasal congestion, no sore throat Cardiovascular:  no chest pain, no palpitations Respiratory:  no cough and no shortness of breath Gastrointestinal:  no abdominal pain, no change in bowel habits GU:  Male: negative for dysuria, frequency, and incontinence and negative for prostate symptoms Musculoskeletal/Extremities:  no pain, redness, or swelling of the joints Integumentary (Skin/Breast):  no abnormal skin lesions reported Neurologic:  no headaches Endocrine: No unexpected weight changes Hematologic/Lymphatic:  no abnormal bleeding  Exam BP 120/80 (BP Location: Left Arm, Patient Position: Sitting, Cuff Size: Large)   Pulse 64   Temp 98.2 F (36.8 C)  (Temporal)   Ht 6\' 5"  (1.956 m)   Wt (!) 329 lb 8 oz (149.5 kg)   SpO2 97%   BMI 39.07 kg/m  General:  well developed, well nourished, in no apparent distress Skin:  no significant moles, warts, or growths Head:  no masses, lesions, or tenderness Eyes:  pupils equal and round, sclera anicteric without injection Ears:  canals without lesions, TMs shiny without retraction, no obvious effusion, no erythema Nose:  nares patent, septum midline, mucosa normal Throat/Pharynx:  lips and gingiva without lesion; tongue and uvula midline; non-inflamed pharynx; no exudates or postnasal drainage Neck: neck supple without adenopathy, thyromegaly, or masses Lungs:  clear to auscultation, breath sounds equal bilaterally, no respiratory distress Cardio:  regular rate and rhythm, no LE edema, no bruits Abdomen:  abdomen soft, nontender; bowel sounds normal; no masses or organomegaly Rectal: Deferred Musculoskeletal:  symmetrical muscle groups noted without atrophy or deformity Extremities:  no clubbing, cyanosis, or edema, no deformities, no skin discoloration Neuro:  gait normal; deep tendon reflexes normal and symmetric Psych: well oriented with normal range of affect and appropriate judgment/insight  Assessment and Plan  Well adult exam - Plan: CBC, Comprehensive metabolic panel, Lipid panel  [name]. Counseled on diet and exercise. Doing very well with wt loss. Counseled on risks and benefits of prostate cancer screening with PSA. The patient agrees to forego testing. Immunizations, labs, and further orders as above. Follow up in 1 yr or prn. The patient voiced  understanding and agreement to the plan.  [name], DO 08/23/18 10:01 AM

## 2018-08-30 ENCOUNTER — Other Ambulatory Visit: Payer: Self-pay

## 2018-08-30 ENCOUNTER — Other Ambulatory Visit (INDEPENDENT_AMBULATORY_CARE_PROVIDER_SITE_OTHER): Payer: 59

## 2018-08-30 DIAGNOSIS — Z0000 Encounter for general adult medical examination without abnormal findings: Secondary | ICD-10-CM

## 2018-08-30 LAB — COMPREHENSIVE METABOLIC PANEL
ALT: 16 U/L (ref 0–53)
AST: 16 U/L (ref 0–37)
Albumin: 4.6 g/dL (ref 3.5–5.2)
Alkaline Phosphatase: 78 U/L (ref 39–117)
BUN: 15 mg/dL (ref 6–23)
CO2: 29 mEq/L (ref 19–32)
Calcium: 9.4 mg/dL (ref 8.4–10.5)
Chloride: 107 mEq/L (ref 96–112)
Creatinine, Ser: 0.85 mg/dL (ref 0.40–1.50)
GFR: 93.41 mL/min (ref >60.00)
Glucose, Bld: 106 mg/dL — ABNORMAL HIGH (ref 70–99)
Potassium: 4.4 mEq/L (ref 3.5–5.1)
Sodium: 141 mEq/L (ref 135–145)
Total Bilirubin: 0.5 mg/dL (ref 0.2–1.2)
Total Protein: 6.9 g/dL (ref 6.0–8.3)

## 2018-08-30 LAB — LIPID PANEL
Cholesterol: 184 mg/dL (ref 0–200)
HDL: 45.7 mg/dL (ref >39.00)
LDL Cholesterol: 128 mg/dL — ABNORMAL HIGH (ref 0–99)
NonHDL: 138.17
Total CHOL/HDL Ratio: 4
Triglycerides: 53 mg/dL (ref 0.0–149.0)
VLDL: 10.6 mg/dL (ref 0.0–40.0)

## 2018-08-30 LAB — CBC
HCT: 43.8 % (ref 39.0–52.0)
Hemoglobin: 14.5 g/dL (ref 13.0–17.0)
MCHC: 33.1 g/dL (ref 30.0–36.0)
MCV: 88.7 fl (ref 78.0–100.0)
Platelets: 205 10*3/uL (ref 150.0–400.0)
RBC: 4.94 Mil/uL (ref 4.22–5.81)
RDW: 14 % (ref 11.5–15.5)
WBC: 5.2 10*3/uL (ref 4.0–10.5)

## 2018-08-30 NOTE — Addendum Note (Signed)
Addended by: Caffie Pinto on: 08/30/2018 09:21 AM   Modules accepted: Orders

## 2018-09-15 ENCOUNTER — Encounter (INDEPENDENT_AMBULATORY_CARE_PROVIDER_SITE_OTHER): Payer: Self-pay | Admitting: Physician Assistant

## 2018-09-15 ENCOUNTER — Ambulatory Visit (INDEPENDENT_AMBULATORY_CARE_PROVIDER_SITE_OTHER): Payer: 59 | Admitting: Physician Assistant

## 2018-09-15 ENCOUNTER — Other Ambulatory Visit: Payer: Self-pay

## 2018-09-15 VITALS — BP 111/66 | HR 64 | Temp 98.3°F | Ht 77.0 in | Wt 318.0 lb

## 2018-09-15 DIAGNOSIS — Z9189 Other specified personal risk factors, not elsewhere classified: Secondary | ICD-10-CM | POA: Diagnosis not present

## 2018-09-15 DIAGNOSIS — E559 Vitamin D deficiency, unspecified: Secondary | ICD-10-CM

## 2018-09-15 DIAGNOSIS — Z6838 Body mass index (BMI) 38.0-38.9, adult: Secondary | ICD-10-CM

## 2018-09-15 DIAGNOSIS — E8881 Metabolic syndrome: Secondary | ICD-10-CM | POA: Diagnosis not present

## 2018-09-15 NOTE — Progress Notes (Signed)
Office: [phone]  /  Fax: [phone]   HPI:   Chief Complaint: OBESITY Timothy Ho is here to discuss his progress with his obesity treatment plan. He is on the Category 4 plan and is following his eating plan approximately 90% of the time. He states he is exercising 0 minutes 0 times per week. Timothy Ho reports doing well getting all of the food in on the plan except vegetables, as he does not enjoy them. He wants to start walking again. His weight is (!) 318 lb (144.2 kg) today and has had a weight loss of 3 pounds over a period of 3 weeks since his last visit. He has lost 54 lbs since starting treatment with Timothy Ho.  Insulin Resistance Timothy Ho has a diagnosis of insulin resistance based on his elevated fasting insulin level >5. Although Timothy Ho blood glucose readings are still under good control, insulin resistance puts him at greater risk of metabolic syndrome and diabetes. He is on no medication currently and continues to work on diet and exercise to decrease risk of diabetes. No polyphagia.  Vitamin D deficiency Timothy Ho has a diagnosis of Vitamin D deficiency. He is currently taking Men's Once-A-Day multivitamin and denies nausea, vomiting or muscle weakness.  At risk for osteopenia and osteoporosis Timothy Ho is at higher risk of osteopenia and osteoporosis due to Vitamin D deficiency.   ASSESSMENT AND PLAN:  Insulin resistance - Plan: Hemoglobin A1c, Insulin, random  Vitamin D deficiency - Plan: VITAMIN D 25 Hydroxy (Vit-D Deficiency, Fractures)  At risk for osteoporosis  Class 2 severe obesity with serious comorbidity and body mass index (BMI) of 38.0 to 38.9 in adult, unspecified obesity type (HCC)  PLAN:  Insulin Resistance Timothy Ho will continue to work on weight loss, exercise, and decreasing simple carbohydrates in his diet to help decrease the risk of diabetes. We dicussed metformin including benefits and risks. He was informed that eating too many simple carbohydrates or too  many calories at one sitting increases the likelihood of GI side effects. Timothy Ho will continue with weight loss and follow-up with Timothy Ho as directed to monitor his progress. He will have labs checked.  Vitamin D Deficiency Timothy Ho was informed that low Vitamin D levels contributes to fatigue and are associated with obesity, breast, and colon cancer. He will have routine testing of Vitamin D and agrees to follow-up with our clinic in 4 weeks.  At risk for osteopenia and osteoporosis Timothy Ho was given extended  (15 minutes) osteoporosis prevention counseling today. Timothy Ho is at risk for osteopenia and osteoporosis due to his Vitamin D deficiency. He was encouraged to take his Vitamin D and follow his higher calcium diet and increase strengthening exercise to help strengthen his bones and decrease his risk of osteopenia and osteoporosis.  Obesity Timothy Ho is currently in the action stage of change. As such, his goal is to continue with weight loss efforts. He has agreed to follow the Category 4 plan. Timothy Ho has been instructed to work up to a goal of 150 minutes of combined cardio and strengthening exercise per week for weight loss and overall health benefits. We discussed the following Behavioral Modification Strategies today: increasing vegetables, work on meal planning and easy cooking plans.  Timothy Ho has agreed to follow-up with our clinic in 4 weeks. He was informed of the importance of frequent follow-up visits to maximize his success with intensive lifestyle modifications for his multiple health conditions.  ALLERGIES: No Known Allergies  MEDICATIONS: No current outpatient medications on file prior to visit.  No current facility-administered medications on file prior to visit.     PAST MEDICAL HISTORY: Past Medical History:  Diagnosis Date   Chronic rhinitis    H/O: pneumonia 2013   Sleep apnea     PAST SURGICAL HISTORY: Past Surgical History:  Procedure Laterality Date    COLONOSCOPY WITH PROPOFOL N/A 09/10/2017   Procedure: COLONOSCOPY WITH PROPOFOL;  Surgeon: Midge Minium, MD;  Location: [facility];  Service: Endoscopy;  Laterality: N/A;   none      SOCIAL HISTORY: Social History   Tobacco Use   Smoking status: Never Smoker   Smokeless tobacco: Never Used   Tobacco comment: Father smoked heavily  Substance Use Topics   Alcohol use: Yes    Alcohol/week: 1.0 - 2.0 standard drinks    Types: 1 - 2 Cans of beer per week    Comment: Beer once or twice a week   Drug use: No    FAMILY HISTORY: Family History  Problem Relation Age of Onset   Diabetes Mother    Hypertension Mother    Obesity Mother    Crohn's disease Sister    Lung disease Neg Hx    Rheumatologic disease Neg Hx    Cancer Neg Hx    ROS: Review of Systems  Gastrointestinal: Negative for nausea and vomiting.  Musculoskeletal:       Negative for muscle weakness.  Endo/Heme/Allergies:       Negative for polyphagia.   PHYSICAL EXAM: Blood pressure 111/66, pulse 64, temperature 98.3 F (36.8 C), temperature source Oral, height 6\' 5"  (1.956 m), weight (!) 318 lb (144.2 kg), SpO2 97 %. Body mass index is 37.71 kg/m. Physical Exam Vitals signs reviewed.  Constitutional:      Appearance: Normal appearance. He is obese.  Cardiovascular:     Rate and Rhythm: Normal rate.     Pulses: Normal pulses.  Pulmonary:     Effort: Pulmonary effort is normal.     Breath sounds: Normal breath sounds.  Musculoskeletal: Normal range of motion.  Skin:    General: Skin is warm and dry.  Neurological:     Mental Status: He is alert and oriented to person, place, and time.  Psychiatric:        Behavior: Behavior normal.   RECENT LABS AND TESTS: BMET    Component Value Date/Time   NA 141 08/30/2018 0922   NA 142 01/11/2018 1258   K 4.4 08/30/2018 0922   CL 107 08/30/2018 0922   CO2 29 08/30/2018 0922   GLUCOSE 106 (H) 08/30/2018 0922   BUN 15 08/30/2018 0922   BUN  10 01/11/2018 1258   CREATININE 0.85 08/30/2018 0922   CALCIUM 9.4 08/30/2018 0922   GFRNONAA 101 01/11/2018 1258   GFRAA 116 01/11/2018 1258   Lab Results  Component Value Date   HGBA1C 5.6 01/11/2018   Lab Results  Component Value Date   INSULIN 15.2 01/11/2018   CBC    Component Value Date/Time   WBC 5.2 08/30/2018 0922   RBC 4.94 08/30/2018 0922   HGB 14.5 08/30/2018 0922   HGB 15.0 01/11/2018 1258   HCT 43.8 08/30/2018 0922   HCT 43.8 01/11/2018 1258   PLT 205.0 08/30/2018 0922   MCV 88.7 08/30/2018 0922   MCV 86 01/11/2018 1258   MCH 29.3 01/11/2018 1258   MCHC 33.1 08/30/2018 0922   RDW 14.0 08/30/2018 0922   RDW 12.8 01/11/2018 1258   LYMPHSABS 1.7 01/11/2018 1258   MONOABS 0.6  10/11/2015 1157   EOSABS 0.1 01/11/2018 1258   BASOSABS 0.1 01/11/2018 1258   Iron/TIBC/Ferritin/ %Sat No results found for: IRON, TIBC, FERRITIN, IRONPCTSAT Lipid Panel     Component Value Date/Time   CHOL 184 08/30/2018 0922   CHOL 174 01/11/2018 1258   TRIG 53.0 08/30/2018 0922   HDL 45.70 08/30/2018 0922   HDL 39 (L) 01/11/2018 1258   CHOLHDL 4 08/30/2018 0922   VLDL 10.6 08/30/2018 0922   LDLCALC 128 (H) 08/30/2018 0922   LDLCALC 115 (H) 01/11/2018 1258   Hepatic Function Panel     Component Value Date/Time   PROT 6.9 08/30/2018 0922   PROT 7.0 01/11/2018 1258   ALBUMIN 4.6 08/30/2018 0922   ALBUMIN 4.4 01/11/2018 1258   AST 16 08/30/2018 0922   ALT 16 08/30/2018 0922   ALKPHOS 78 08/30/2018 0922   BILITOT 0.5 08/30/2018 0922   BILITOT 0.7 01/11/2018 1258      Component Value Date/Time   TSH 1.730 01/11/2018 1258   Results for Timothy Ho, Timothy Ho (MRN [id number]) as of 09/15/2018 13:01  Ref. Range 01/11/2018 12:58  Vitamin D, 25-Hydroxy Latest Ref Range: 30.0 - 100.0 ng/mL 10.7 (L)   OBESITY BEHAVIORAL INTERVENTION VISIT  Today's visit was #12   Starting weight: 372 lbs Starting date: 01/11/2018 Today's weight: 318 lbs Today's date: 09/15/2018 Total lbs lost  to date: 54    09/15/2018  Height 6\' 5"  (1.956 m)  Weight 318 lb (144.2 kg) (A)  BMI (Calculated) 37.7  BLOOD PRESSURE - SYSTOLIC 756  BLOOD PRESSURE - DIASTOLIC 66   Body Fat % 37 %  Total Body Water (lbs) 139.2 lbs   ASK: We discussed the diagnosis of obesity with Timothy Ho today and Timothy Ho agreed to give [place] permission to discuss obesity behavioral modification therapy today.  ASSESS: Timothy Ho has the diagnosis of obesity and his BMI today is 38.8. Timothy Ho is in the action stage of change.   ADVISE: Timothy Ho was educated on the multiple health risks of obesity as well as the benefit of weight loss to improve his health. He was advised of the need for long term treatment and the importance of lifestyle modifications to improve his current health and to decrease his risk of future health problems.  AGREE: Multiple dietary modification options and treatment options were discussed and  Timothy Ho agreed to follow the recommendations documented in the above note.  ARRANGE: Timothy Ho was educated on the importance of frequent visits to treat obesity as outlined per CMS and USPSTF guidelines and agreed to schedule his next follow up appointment today.  Timothy Ho, am acting as transcriptionist for Timothy Potash, PA-C I, Timothy Potash, PA-C have reviewed above note and agree with its content

## 2018-09-16 LAB — VITAMIN D 25 HYDROXY (VIT D DEFICIENCY, FRACTURES): Vit D, 25-Hydroxy: 25.3 ng/mL — ABNORMAL LOW (ref 30.0–100.0)

## 2018-09-16 LAB — HEMOGLOBIN A1C
Est. average glucose Bld gHb Est-mCnc: 103 mg/dL
Hgb A1c MFr Bld: 5.2 % (ref 4.8–5.6)

## 2018-09-16 LAB — INSULIN, RANDOM: INSULIN: 17.6 u[IU]/mL (ref 2.6–24.9)

## 2018-10-13 ENCOUNTER — Ambulatory Visit (INDEPENDENT_AMBULATORY_CARE_PROVIDER_SITE_OTHER): Payer: 59 | Admitting: Physician Assistant

## 2018-10-20 ENCOUNTER — Ambulatory Visit (INDEPENDENT_AMBULATORY_CARE_PROVIDER_SITE_OTHER): Payer: 59 | Admitting: Physician Assistant

## 2018-10-20 ENCOUNTER — Other Ambulatory Visit: Payer: Self-pay

## 2018-10-20 VITALS — BP 113/69 | HR 75 | Temp 98.6°F | Ht 77.0 in | Wt 326.0 lb

## 2018-10-20 DIAGNOSIS — E559 Vitamin D deficiency, unspecified: Secondary | ICD-10-CM

## 2018-10-20 DIAGNOSIS — E7849 Other hyperlipidemia: Secondary | ICD-10-CM

## 2018-10-20 DIAGNOSIS — Z6838 Body mass index (BMI) 38.0-38.9, adult: Secondary | ICD-10-CM | POA: Diagnosis not present

## 2018-10-20 DIAGNOSIS — Z9189 Other specified personal risk factors, not elsewhere classified: Secondary | ICD-10-CM | POA: Diagnosis not present

## 2018-10-25 NOTE — Progress Notes (Signed)
Office: [phone]  /  Fax: [phone]   HPI:   Chief Complaint: OBESITY Timothy Ho is here to discuss his progress with his obesity treatment plan. He is on the Category 4 plan and is following his eating plan approximately 95% of the time. He states he is exercising 0 minutes 0 times per week. Kordell reports that he splurged on and off the last few weeks. He has been stress eating due to issues at work.  His weight is (!) 326 lb (147.9 kg) today and has had a weight gain of 8 lbs since his last visit. He has lost 46 lbs since starting treatment with [place].   Hyperlipidemia Timothy Ho has hyperlipidemia and has been trying to improve his cholesterol levels with intensive lifestyle modification including a low saturated fat diet, exercise and weight loss. He is on no medication and denies any chest pain.  At risk for cardiovascular disease Timothy Ho is at a higher than average risk for cardiovascular disease due to obesity. He currently denies any chest pain.  Vitamin D deficiency Timothy Ho has a diagnosis of Vitamin D deficiency. He prefers OTC versus taking prescription. His last level was improved on multivitamin but is not at goal. He denies nausea, vomiting or muscle weakness.  ASSESSMENT AND PLAN:  Other hyperlipidemia  Vitamin D deficiency  At risk for heart disease  Class 2 severe obesity with serious comorbidity and body mass index (BMI) of 38.0 to 38.9 in adult, unspecified obesity type (HCC)  PLAN:  Hyperlipidemia Timothy Ho was informed of the American Heart Association Guidelines emphasizing intensive lifestyle modifications as the first line treatment for hyperlipidemia. We discussed many lifestyle modifications today in depth, and Timothy Ho will continue to work on decreasing saturated fats such as fatty red meat, butter and many fried foods. He will also increase vegetables and lean protein in his diet and continue to work on exercise and weight loss efforts.  Cardiovascular risk  counseling Timothy Ho was given extended (15 minutes) coronary artery disease prevention counseling today. He is 53 y.o. male and has risk factors for heart disease including obesity. We discussed intensive lifestyle modifications today with an emphasis on specific weight loss instructions and strategies. Pt was also informed of the importance of increasing exercise and decreasing saturated fats to help prevent heart disease.  Vitamin D Deficiency Timothy Ho was informed that low Vitamin D levels contributes to fatigue and are associated with obesity, breast, and colon cancer. He agrees to start OTC Vit D 5,000 units daily and will follow-up for routine testing of Vitamin D, at least 2-3 times per year. He was informed of the risk of over-replacement of Vitamin D and agrees to not increase his dose unless he discusses this with [place] first. Timothy Ho agrees to follow-up with our clinic in 4 weeks.  Obesity Timothy Ho is currently in the action stage of change. As such, his goal is to continue with weight loss efforts. He has agreed to follow the Category 4 plan and journal 1800-1900 calories and 115 grams of protein daily. Timothy Ho has been instructed to work up to a goal of 150 minutes of combined cardio and strengthening exercise per week for weight loss and overall health benefits. We discussed the following Behavioral Modification Strategies today: work on meal planning and easy cooking plans, and keeping healthy foods in the home.  Timothy Ho has agreed to follow-up with our clinic in 4 weeks. He was informed of the importance of frequent follow-up visits to maximize his success with intensive lifestyle modifications for  his multiple health conditions.  ALLERGIES: No Known Allergies  MEDICATIONS: No current outpatient medications on file prior to visit.   No current facility-administered medications on file prior to visit.     PAST MEDICAL HISTORY: Past Medical History:  Diagnosis Date   Chronic rhinitis      H/O: pneumonia 2013   Sleep apnea     PAST SURGICAL HISTORY: Past Surgical History:  Procedure Laterality Date   COLONOSCOPY WITH PROPOFOL N/A 09/10/2017   Procedure: COLONOSCOPY WITH PROPOFOL;  Surgeon: Midge MiniumWohl, Darren, MD;  [facility];  Service: Endoscopy;  Laterality: N/A;   none      SOCIAL HISTORY: Social History   Tobacco Use   Smoking status: Never Smoker   Smokeless tobacco: Never Used   Tobacco comment: Father smoked heavily  Substance Use Topics   Alcohol use: Yes    Alcohol/week: 1.0 - 2.0 standard drinks    Types: 1 - 2 Cans of beer per week    Comment: Beer once or twice a week   Drug use: No    FAMILY HISTORY: Family History  Problem Relation Age of Onset   Diabetes Mother    Hypertension Mother    Obesity Mother    Crohn's disease Sister    Lung disease Neg Hx    Rheumatologic disease Neg Hx    Cancer Neg Hx    ROS: Review of Systems  Cardiovascular: Negative for chest pain.  Gastrointestinal: Negative for nausea and vomiting.  Musculoskeletal:       Negative for muscle weakness.   PHYSICAL EXAM: Blood pressure 113/69, pulse 75, temperature 98.6 F (37 C), temperature source Oral, height 6\' 5"  (1.956 m), weight (!) 326 lb (147.9 kg), SpO2 96 %. Body mass index is 38.66 kg/m. Physical Exam Vitals signs reviewed.  Constitutional:      Appearance: Normal appearance. He is obese.  Cardiovascular:     Rate and Rhythm: Normal rate.     Pulses: Normal pulses.  Pulmonary:     Effort: Pulmonary effort is normal.     Breath sounds: Normal breath sounds.  Musculoskeletal: Normal range of motion.  Skin:    General: Skin is warm and dry.  Neurological:     Mental Status: He is alert and oriented to person, place, and time.  Psychiatric:        Behavior: Behavior normal.   RECENT LABS AND TESTS: BMET    Component Value Date/Time   NA 141 08/30/2018 0922   NA 142 01/11/2018 1258   K 4.4 08/30/2018 0922   CL  107 08/30/2018 0922   CO2 29 08/30/2018 0922   GLUCOSE 106 (H) 08/30/2018 0922   BUN 15 08/30/2018 0922   BUN 10 01/11/2018 1258   CREATININE 0.85 08/30/2018 0922   CALCIUM 9.4 08/30/2018 0922   GFRNONAA 101 01/11/2018 1258   GFRAA 116 01/11/2018 1258   Lab Results  Component Value Date   HGBA1C 5.2 09/15/2018   HGBA1C 5.6 01/11/2018   Lab Results  Component Value Date   INSULIN 17.6 09/15/2018   INSULIN 15.2 01/11/2018   CBC    Component Value Date/Time   WBC 5.2 08/30/2018 0922   RBC 4.94 08/30/2018 0922   HGB 14.5 08/30/2018 0922   HGB 15.0 01/11/2018 1258   HCT 43.8 08/30/2018 0922   HCT 43.8 01/11/2018 1258   PLT 205.0 08/30/2018 0922   MCV 88.7 08/30/2018 0922   MCV 86 01/11/2018 1258   MCH 29.3 01/11/2018 1258  MCHC 33.1 08/30/2018 0922   RDW 14.0 08/30/2018 0922   RDW 12.8 01/11/2018 1258   LYMPHSABS 1.7 01/11/2018 1258   MONOABS 0.6 10/11/2015 1157   EOSABS 0.1 01/11/2018 1258   BASOSABS 0.1 01/11/2018 1258   Iron/TIBC/Ferritin/ %Sat No results found for: IRON, TIBC, FERRITIN, IRONPCTSAT Lipid Panel     Component Value Date/Time   CHOL 184 08/30/2018 0922   CHOL 174 01/11/2018 1258   TRIG 53.0 08/30/2018 0922   HDL 45.70 08/30/2018 0922   HDL 39 (L) 01/11/2018 1258   CHOLHDL 4 08/30/2018 0922   VLDL 10.6 08/30/2018 0922   LDLCALC 128 (H) 08/30/2018 0922   LDLCALC 115 (H) 01/11/2018 1258   Hepatic Function Panel     Component Value Date/Time   PROT 6.9 08/30/2018 0922   PROT 7.0 01/11/2018 1258   ALBUMIN 4.6 08/30/2018 0922   ALBUMIN 4.4 01/11/2018 1258   AST 16 08/30/2018 0922   ALT 16 08/30/2018 0922   ALKPHOS 78 08/30/2018 0922   BILITOT 0.5 08/30/2018 0922   BILITOT 0.7 01/11/2018 1258      Component Value Date/Time   TSH 1.730 01/11/2018 1258   Results for Bhardwaj, DEMAREA LOREY (MRN [id number]) as of 10/25/2018 10:27  Ref. Range 09/15/2018 10:25  Vitamin D, 25-Hydroxy Latest Ref Range: 30.0 - 100.0 ng/mL 25.3 (L)   OBESITY  BEHAVIORAL INTERVENTION VISIT  Today's visit was #13   Starting weight: 372 lbs Starting date: 01/11/2018 Today's weight: 326 lbs  Today's date: 10/20/2018 Total lbs lost to date: 46    10/20/2018  Height 6\' 5"  (1.956 m)  Weight 326 lb (147.9 kg) (A)  BMI (Calculated) 38.65  BLOOD PRESSURE - SYSTOLIC 885  BLOOD PRESSURE - DIASTOLIC 69   Body Fat % 02.7 %  Total Body Water (lbs) 143 lbs   ASK: We discussed the diagnosis of obesity with Kelly Splinter today and Pinchus agreed to give [place] permission to discuss obesity behavioral modification therapy today.  ASSESS: Rahiem has the diagnosis of obesity and his BMI today is 38.7. Kenny is in the action stage of change.   ADVISE: Stony was educated on the multiple health risks of obesity as well as the benefit of weight loss to improve his health. He was advised of the need for long term treatment and the importance of lifestyle modifications to improve his current health and to decrease his risk of future health problems.  AGREE: Multiple dietary modification options and treatment options were discussed and  Faolan agreed to follow the recommendations documented in the above note.  ARRANGE: Nashaun was educated on the importance of frequent visits to treat obesity as outlined per CMS and USPSTF guidelines and agreed to schedule his next follow up appointment today.  Migdalia Dk, am acting as transcriptionist for Abby Potash, PA-C I, Abby Potash, PA-C have reviewed above note and agree with its content

## 2018-11-17 ENCOUNTER — Ambulatory Visit (INDEPENDENT_AMBULATORY_CARE_PROVIDER_SITE_OTHER): Payer: 59 | Admitting: Physician Assistant

## 2018-11-21 ENCOUNTER — Encounter (INDEPENDENT_AMBULATORY_CARE_PROVIDER_SITE_OTHER): Payer: Self-pay

## 2018-11-23 ENCOUNTER — Ambulatory Visit (INDEPENDENT_AMBULATORY_CARE_PROVIDER_SITE_OTHER): Payer: 59 | Admitting: Family Medicine

## 2018-12-13 ENCOUNTER — Encounter: Payer: Self-pay | Admitting: Family Medicine

## 2019-01-11 ENCOUNTER — Telehealth: Payer: Self-pay

## 2019-01-11 NOTE — Telephone Encounter (Signed)
Unable to get in contact with the patient. LVM asking him would he be able to do a 2:00 mychart video visit with Megan. Office number was provided.    If patient calls back please see if he is able to do a mychart video visit at 2 pm or he will need to be r/s.

## 2019-01-12 ENCOUNTER — Ambulatory Visit: Payer: 59 | Admitting: Adult Health

## 2019-01-12 ENCOUNTER — Telehealth: Payer: Self-pay | Admitting: Adult Health

## 2019-01-12 ENCOUNTER — Other Ambulatory Visit: Payer: Self-pay

## 2019-01-12 DIAGNOSIS — G4733 Obstructive sleep apnea (adult) (pediatric): Secondary | ICD-10-CM | POA: Diagnosis not present

## 2019-01-12 NOTE — Telephone Encounter (Signed)
Patient arrived to his apt today. He was still on the sch and was checked in. He was VERY upset stating he never got a call that his apt was cancelled. His contact numbers were verified.  He said he only wants to be resch unless it's absolutely necessary. This was a yearly follow up. Best call back [phone]

## 2019-01-12 NOTE — Telephone Encounter (Signed)
According to the previous phone note the patient was called and a message was left.  Please advise the patient that if he is using the CPAP he will need at least a yearly appointment.  I am happy to see him virtually if he prefers this.

## 2019-01-26 NOTE — Progress Notes (Signed)
PATIENT: Timothy Ho DOB: [birth year]  REASON FOR VISIT: follow up HISTORY FROM: patient  Virtual Visit via Telephone Note  I connected with Kelly Splinter on 01/30/19 at  8:00 AM EST by telephone and verified that I am speaking with the correct person using two identifiers.   I discussed the limitations, risks, security and privacy concerns of performing an evaluation and management service by telephone and the availability of in person appointments. I also discussed with the patient that there may be a patient responsible charge related to this service. The patient expressed understanding and agreed to proceed.   History of Present Illness:  01/30/19 Timothy Ho is a 55 y.o. 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  He continues to do well on CPAP therapy.  He reports consistent use.  Compliance report dated 12/27/2018 through 01/25/2019 reveals that he has used CPAP nightly for compliance of 100%.  He has used CPAP greater than 4 hours every night.  Average usage was 7 hours and 41 minutes.  Residual AHI was 0.8 on 4 to 8 cm of water and an EPR of 3.  There was no significant leak noted.  History (copied from [employer] note on 01/06/2018)   [name] presents today for Cpap follow up. He is compliant with CPAP usage as download shows usage of 30/30 days for 100% compliance. He used his machine for > 4 hours 30/30 days for 100% compliance. On average he uses his CPAP 8 hours and 2 minutes per day. His residual AHI is 1.3  on 4-8 cmH2O with EPR of 3. There is no leak. He reports feeling less fatigued in the mornings. He is here today for an evaluation.   HISTORY (Copied from Dr.Dohmeier's note)  Timothy Drost McDevittis a 54 y.o.malepatient seen on 9-18-2019seen here as in a referral from Dr. Alvino Chapel a sleep evaluation.  Chief complaint according to patient :Son noticed I have the pleasure meeting Mr. Timothy Ho today, who brought  his spouse-another patient of mine-today to the meeting. His main concern has been that family members noticed him snoring,  but just last week he underwent a colonoscopy and was made aware by the nurse that he snored andpausedin his breathing.   Sleep habits are as follows:He introduced exercises on week ends and feels almost a high after it. He feels wound up but is able to sleep about 2 hours later. Watches Tv , goes to bed by 11.30- and asleep by midnight. Some nights he uses Zyquil. The bedroom is cool, quiet and dark.  He will sleep for 6 hours, rarely has nocturia. He sleeps promptly and his wife noted crescendo breathing. He sleeps on his left side. He uses 2 pillows for head support, flat bed.He may have one bathroom break at night but usually has no difficulties to go back to sleep. Once he wakes up in the morning at about 6 AM he is not refreshed and restored and feels that he would crave more sleep.  Sleep medical history and family sleep history:Brother Timothy Ho has apnea. Brother Timothy Ho has insomnia.  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  Non smoker, ETOH- socially 1-2/ week. Caffeine: 2-4 cups in AM of coffee, none after lunch. No iced tea , but soda at dinner.    Observations/Objective:  Generalized: Well developed, in no acute distress  Mentation: Alert oriented to time, place, history taking. Follows all commands  speech and language fluent   Assessment and Plan:  54 y.o. year old male  has a past medical history of Chronic rhinitis, H/O: pneumonia (2013), and Sleep apnea. here with    ICD-10-CM   1. Complex sleep apnea syndrome  G47.31   2. OSA on CPAP  G47.33    Z99.89    Timothy Ho is doing very well on CPAP therapy.  Compliance report reveals excellent compliance.  He was encouraged to continue using CPAP nightly and greater than 4 hours each night.  We have reviewed diagnosis of complex sleep apnea with both central  and obstructive apneas occurring.  I reviewed his sleep study in detail.  All questions answered.  I recommend 1 year follow-up.  This has been scheduled via MyChart for January 30, 2020.  He is aware that he may call for any additional concerns that may arise over this next year.  He verbalizes understanding and agreement with this plan.   No orders of the defined types were placed in this encounter.   No orders of the defined types were placed in this encounter.     Follow Up Instructions:  I discussed the assessment and treatment plan with the patient. The patient was provided an opportunity to ask questions and all were answered. The patient agreed with the plan and demonstrated an understanding of the instructions.   The patient was advised to call back or seek an in-person evaluation if the symptoms worsen or if the condition fails to improve as anticipated.  I provided 15 minutes of non-face-to-face time during this encounter.  Patient is located at his place of residence during my chart visit.  Providers in the office.   Shawnie Dapper, NP

## 2019-01-30 ENCOUNTER — Encounter: Payer: Self-pay | Admitting: Family Medicine

## 2019-01-30 ENCOUNTER — Telehealth (INDEPENDENT_AMBULATORY_CARE_PROVIDER_SITE_OTHER): Payer: 59 | Admitting: Family Medicine

## 2019-01-30 DIAGNOSIS — G4731 Primary central sleep apnea: Secondary | ICD-10-CM

## 2019-01-30 DIAGNOSIS — G4733 Obstructive sleep apnea (adult) (pediatric): Secondary | ICD-10-CM

## 2019-01-30 DIAGNOSIS — Z9989 Dependence on other enabling machines and devices: Secondary | ICD-10-CM

## 2019-02-02 ENCOUNTER — Encounter: Payer: Self-pay | Admitting: Family Medicine

## 2019-02-09 ENCOUNTER — Other Ambulatory Visit: Payer: Self-pay

## 2019-02-10 ENCOUNTER — Ambulatory Visit: Payer: 59 | Admitting: Family Medicine

## 2019-02-10 ENCOUNTER — Encounter: Payer: Self-pay | Admitting: Family Medicine

## 2019-02-10 VITALS — BP 122/80 | HR 72 | Temp 98.2°F | Resp 18 | Wt 354.8 lb

## 2019-02-10 DIAGNOSIS — L918 Other hypertrophic disorders of the skin: Secondary | ICD-10-CM

## 2019-02-10 NOTE — Patient Instructions (Signed)
Do not shower for the rest of the day. When you do wash it, use only soap and water. Do not vigorously scrub. Apply triple antibiotic ointment (like Neosporin) twice daily. Keep the area clean and dry.   Things to look out for: increasing pain not relieved by ibuprofen/acetaminophen, fevers, spreading redness, drainage of pus, or foul odor.  Let us know if you need anything. 

## 2019-02-10 NOTE — Progress Notes (Signed)
Chief Complaint  Patient presents with  . skin tag    painful skin tag on his back     Timothy Ho is a 54 y.o. male here for a skin complaint.  Duration: several weeks Location: back Pruritic? No Painful? Yes Drainage? No New soaps/lotions/topicals/detergents? No Other associated symptoms: Has had skin tags before, never paiful Therapies tried thus far: none  ROS:  Const: No fevers Skin: As noted in HPI  Past Medical History:  Diagnosis Date  . Chronic rhinitis   . H/O: pneumonia 2013  . Sleep apnea     BP 122/80 (BP Location: Left Arm, Patient Position: Sitting, Cuff Size: Normal)   Pulse 72   Temp 98.2 F (36.8 C) (Temporal)   Resp 18   Wt (!) 354 lb 12.8 oz (160.9 kg)   SpO2 98%   BMI 42.07 kg/m  Gen: awake, alert, appearing stated age Lungs: No accessory muscle use Skin: Irritated and erythematous skin tag over L mid back. No drainage, fluctuance Psych: Age appropriate judgment and insight  Procedure note; skin tag removal Informed consent obtained. Area cleaned w alcohol, anesthetized w 1% lido w epi 0.5 mL. Lesion was then removed with scissors. Hemostasis was obtained. There were no complications noted. Bandaid and TAO placed.  The patient tolerated the procedure well.  Skin tag - Plan: PR DESTRUCTION BENIGN LESIONS UP TO 14  Successful removal of painful skin lesion.  Aftercare instructions verbalized and written down. F/u prn. The patient voiced understanding and agreement to the plan.  Jilda Roche [place], DO 02/10/19 1:02 PM

## 2019-03-07 DIAGNOSIS — H524 Presbyopia: Secondary | ICD-10-CM | POA: Diagnosis not present

## 2019-04-19 DIAGNOSIS — G4733 Obstructive sleep apnea (adult) (pediatric): Secondary | ICD-10-CM | POA: Diagnosis not present

## 2019-07-04 ENCOUNTER — Encounter: Payer: Self-pay | Admitting: Medical

## 2019-07-04 ENCOUNTER — Ambulatory Visit (INDEPENDENT_AMBULATORY_CARE_PROVIDER_SITE_OTHER): Payer: 59 | Admitting: Medical

## 2019-07-04 ENCOUNTER — Other Ambulatory Visit: Payer: Self-pay

## 2019-07-04 VITALS — BP 137/73 | HR 84 | Temp 97.2°F | Resp 18 | Ht 77.0 in | Wt 370.0 lb

## 2019-07-04 DIAGNOSIS — Z1283 Encounter for screening for malignant neoplasm of skin: Secondary | ICD-10-CM | POA: Diagnosis not present

## 2019-07-04 DIAGNOSIS — Z125 Encounter for screening for malignant neoplasm of prostate: Secondary | ICD-10-CM

## 2019-07-04 DIAGNOSIS — Z0000 Encounter for general adult medical examination without abnormal findings: Secondary | ICD-10-CM | POA: Diagnosis not present

## 2019-07-04 NOTE — Patient Instructions (Addendum)
For you wellness exam today I have ordered cbc, cmp and lipid panel.(psa to be included)  Referral to derm for skin cancer screening.  Up to date on colonoscopy.  Recommend exercise and healthy diet.  We will let you know lab results as they come in.  Follow up date appointment will be determined after lab review.    Preventive Care 79-56 Years Old, Male Preventive care refers to lifestyle choices and visits with your health care provider that can promote health and wellness. This includes:  A yearly physical exam. This is also called an annual well check.  Regular dental and eye exams.  Immunizations.  Screening for certain conditions.  Healthy lifestyle choices, such as eating a healthy diet, getting regular exercise, not using drugs or products that contain nicotine and tobacco, and limiting alcohol use. What can I expect for my preventive care visit? Physical exam Your health care provider will check:  Height and weight. These may be used to calculate body mass index (BMI), which is a measurement that tells if you are at a healthy weight.  Heart rate and blood pressure.  Your skin for abnormal spots. Counseling Your health care provider may ask you questions about:  Alcohol, tobacco, and drug use.  Emotional well-being.  Home and relationship well-being.  Sexual activity.  Eating habits.  Work and work Statistician. What immunizations do I need?  Influenza (flu) vaccine  This is recommended every year. Tetanus, diphtheria, and pertussis (Tdap) vaccine  You may need a Td booster every 10 years. Varicella (chickenpox) vaccine  You may need this vaccine if you have not already been vaccinated. Zoster (shingles) vaccine  You may need this after age 72. Measles, mumps, and rubella (MMR) vaccine  You may need at least one dose of MMR if you were born in 1957 or later. You may also need a second dose. Pneumococcal conjugate (PCV13) vaccine  You may need  this if you have certain conditions and were not previously vaccinated. Pneumococcal polysaccharide (PPSV23) vaccine  You may need one or two doses if you smoke cigarettes or if you have certain conditions. Meningococcal conjugate (MenACWY) vaccine  You may need this if you have certain conditions. Hepatitis A vaccine  You may need this if you have certain conditions or if you travel or work in places where you may be exposed to hepatitis A. Hepatitis B vaccine  You may need this if you have certain conditions or if you travel or work in places where you may be exposed to hepatitis B. Haemophilus influenzae type b (Hib) vaccine  You may need this if you have certain risk factors. Human papillomavirus (HPV) vaccine  If recommended by your health care provider, you may need three doses over 6 months. You may receive vaccines as individual doses or as more than one vaccine together in one shot (combination vaccines). Talk with your health care provider about the risks and benefits of combination vaccines. What tests do I need? Blood tests  Lipid and cholesterol levels. These may be checked every 5 years, or more frequently if you are over 34 years old.  Hepatitis C test.  Hepatitis B test. Screening  Lung cancer screening. You may have this screening every year starting at age 27 if you have a 30-pack-year history of smoking and currently smoke or have quit within the past 15 years.  Prostate cancer screening. Recommendations will vary depending on your family history and other risks.  Colorectal cancer screening. All adults should  have this screening starting at age 64 and continuing until age 76. Your health care provider may recommend screening at age 70 if you are at increased risk. You will have tests every 1-10 years, depending on your results and the type of screening test.  Diabetes screening. This is done by checking your blood sugar (glucose) after you have not eaten for a  while (fasting). You may have this done every 1-3 years.  Sexually transmitted disease (STD) testing. Follow these instructions at home: Eating and drinking  Eat a diet that includes fresh fruits and vegetables, whole grains, lean protein, and low-fat dairy products.  Take vitamin and mineral supplements as recommended by your health care provider.  Do not drink alcohol if your health care provider tells you not to drink.  If you drink alcohol: ? Limit how much you have to 0-2 drinks a day. ? Be aware of how much alcohol is in your drink. In the [place] one drink equals one 12 oz bottle of beer (355 mL), one 5 oz glass of wine (148 mL), or one 1 oz glass of hard liquor (44 mL). Lifestyle  Take daily care of your teeth and gums.  Stay active. Exercise for at least 30 minutes on 5 or more days each week.  Do not use any products that contain nicotine or tobacco, such as cigarettes, e-cigarettes, and chewing tobacco. If you need help quitting, ask your health care provider.  If you are sexually active, practice safe sex. Use a condom or other form of protection to prevent STIs (sexually transmitted infections).  Talk with your health care provider about taking a low-dose aspirin every day starting at age 52. What's next?  Go to your health care provider once a year for a well check visit.  Ask your health care provider how often you should have your eyes and teeth checked.  Stay up to date on all vaccines. This information is not intended to replace advice given to you by your health care provider. Make sure you discuss any questions you have with your health care provider. Document Revised: 12/16/2017 Document Reviewed: 12/16/2017 Elsevier Patient Education  2020 [employer].

## 2019-07-04 NOTE — Progress Notes (Signed)
Subjective:    Patient ID: Timothy Ho, male    DOB: 1965/09/18, 54 y.o.   MRN: [id number]  HPI  Pt in for cpe/wellness exam.  Pt works in Research scientist (medical).  Pt is not fasting. Pt has not been exercising as much as he wants. Only day every 2 weeks. But has joined gym. Has gotten covid vaccine. Drinks one beer a day. Non smoker.    Review of Systems  Constitutional: Negative for chills, fatigue and fever.  HENT: Negative for congestion and ear pain.   Respiratory: Negative for cough, chest tightness, shortness of breath and wheezing.   Cardiovascular: Negative for chest pain and palpitations.  Gastrointestinal: Negative for abdominal pain.  Musculoskeletal: Negative for back pain.  Skin: Negative for rash.  Neurological: Negative for dizziness, seizures, weakness and headaches.  Hematological: Negative for adenopathy. Does not bruise/bleed easily.  Psychiatric/Behavioral: Negative for behavioral problems, confusion and sleep disturbance. The patient is not nervous/anxious.     Past Medical History:  Diagnosis Date  . Chronic rhinitis   . H/O: pneumonia 2013  . Sleep apnea      Social History   Socioeconomic History  . Marital status: Married    Spouse name: Marcelino Duster  . Number of children: 2  . Years of education: Not on file  . Highest education level: Not on file  Occupational History  . Occupation: Stage manager  Tobacco Use  . Smoking status: Never Smoker  . Smokeless tobacco: Never Used  . Tobacco comment: Father smoked heavily  Vaping Use  . Vaping Use: Never used  Substance and Sexual Activity  . Alcohol use: Yes    Alcohol/week: 1.0 - 2.0 standard drink    Types: 1 - 2 Cans of beer per week    Comment: Beer once or twice a week  . Drug use: No  . Sexual activity: Yes    Partners: Female  Other Topics Concern  . Not on file  Social History Narrative   Originally from [place]. He moved to [facility] in December 2016. 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 Does have a dog. No bird exposure. No mold or hot tub exposure. Questionable asbestos exposure as a child.    Social Determinants of Health   Financial Resource Strain:   . Difficulty of Paying Living Expenses:   Food Insecurity:   . Worried About Programme researcher, broadcasting/film/video in the Last Year:   . Barista in the Last Year:   Transportation Needs:   . Freight forwarder (Medical):   Marland Kitchen Lack of Transportation (Non-Medical):   Physical Activity:   . Days of Exercise per Week:   . Minutes of Exercise per Session:   Stress:   . Feeling of Stress :   Social Connections:   . Frequency of Communication with Friends and Family:   . Frequency of Social Gatherings with Friends and Family:   . Attends Religious Services:   . Active Member of Clubs or Organizations:   . Attends Banker Meetings:   Marland Kitchen Marital Status:   Intimate Partner Violence:   . Fear of Current or Ex-Partner:   . Emotionally Abused:   Marland Kitchen Physically Abused:   . Sexually Abused:     Past Surgical History:  Procedure Laterality Date  . COLONOSCOPY WITH PROPOFOL N/A 09/10/2017   Procedure: COLONOSCOPY WITH PROPOFOL;  Surgeon: Midge Minium,  MD;  Location: [facility];  Service: Endoscopy;  Laterality: N/A;  . none      Family History  Problem Relation Age of Onset  . Diabetes Mother   . Hypertension Mother   . Obesity Mother   . Crohn's disease Sister   . Lung disease Neg Hx   . Rheumatologic disease Neg Hx   . Cancer Neg Hx     No Known Allergies  No current outpatient medications on file prior to visit.   No current facility-administered medications on file prior to visit.    BP 137/73 (BP Location: Left Arm, Patient Position: Sitting, Cuff Size: Large)   Pulse 84   Temp (!) 97.2 F (36.2 C) (Temporal)   Resp 18   Ht 6\' 5"  (1.956 m)   Wt (!) 370 lb (167.8 kg)   SpO2 99%   BMI 43.88 kg/m       Objective:   Physical  Exam  General Mental Status- Alert. General Appearance- Not in acute distress.   Skin General: Color- Normal Color. Moisture- Normal Moisture. Varying size and numerous moles on back.  Neck Carotid Arteries- Normal color. Moisture- Normal Moisture. No carotid bruits. No JVD.  Chest and Lung Exam Auscultation: Breath Sounds:-Normal.  Cardiovascular Auscultation:Rythm- Regular. Murmurs & Other Heart Sounds:Auscultation of the heart reveals- No Murmurs.  Abdomen Inspection:-Inspeection Normal. Palpation/Percussion:Note:No mass. Palpation and Percussion of the abdomen reveal- Non Tender, Non Distended + BS, no rebound or guarding.   Neurologic Cranial Nerve exam:- CN III-XII intact(No nystagmus), symmetric smile. Strength:- 5/5 equal and symmetric strength both upper and lower extremities.      Assessment & Plan:  For you wellness exam today I have ordered cbc, cmp and lipid panel.(psa to be included)  Referral to derm for skin cancer screening.  Up to date on colonoscopy.  Recommend exercise and healthy diet.  We will let you know lab results as they come in.  Follow up date appointment will be determined after lab review.  , PA-C

## 2019-07-18 ENCOUNTER — Other Ambulatory Visit: Payer: Self-pay

## 2019-07-18 ENCOUNTER — Other Ambulatory Visit (INDEPENDENT_AMBULATORY_CARE_PROVIDER_SITE_OTHER): Payer: 59

## 2019-07-18 DIAGNOSIS — Z125 Encounter for screening for malignant neoplasm of prostate: Secondary | ICD-10-CM

## 2019-07-18 DIAGNOSIS — Z0000 Encounter for general adult medical examination without abnormal findings: Secondary | ICD-10-CM

## 2019-07-18 LAB — LIPID PANEL
Cholesterol: 197 mg/dL (ref 0–200)
HDL: 44.4 mg/dL (ref >39.00)
LDL Cholesterol: 137 mg/dL — ABNORMAL HIGH (ref 0–99)
NonHDL: 152.9
Total CHOL/HDL Ratio: 4
Triglycerides: 81 mg/dL (ref 0.0–149.0)
VLDL: 16.2 mg/dL (ref 0.0–40.0)

## 2019-07-18 LAB — COMPREHENSIVE METABOLIC PANEL
ALT: 24 U/L (ref 0–53)
AST: 21 U/L (ref 0–37)
Albumin: 4.5 g/dL (ref 3.5–5.2)
Alkaline Phosphatase: 85 U/L (ref 39–117)
BUN: 11 mg/dL (ref 6–23)
CO2: 25 mEq/L (ref 19–32)
Calcium: 9.4 mg/dL (ref 8.4–10.5)
Chloride: 106 mEq/L (ref 96–112)
Creatinine, Ser: 0.88 mg/dL (ref 0.40–1.50)
GFR: 89.46 mL/min (ref >60.00)
Glucose, Bld: 107 mg/dL — ABNORMAL HIGH (ref 70–99)
Potassium: 4.3 mEq/L (ref 3.5–5.1)
Sodium: 141 mEq/L (ref 135–145)
Total Bilirubin: 0.6 mg/dL (ref 0.2–1.2)
Total Protein: 7.1 g/dL (ref 6.0–8.3)

## 2019-07-18 LAB — CBC WITH DIFFERENTIAL/PLATELET
Basophils Absolute: 0.1 10*3/uL (ref 0.0–0.1)
Basophils Relative: 0.9 % (ref 0.0–3.0)
Eosinophils Absolute: 0.1 10*3/uL (ref 0.0–0.7)
Eosinophils Relative: 1.6 % (ref 0.0–5.0)
HCT: 45.2 % (ref 39.0–52.0)
Hemoglobin: 15.2 g/dL (ref 13.0–17.0)
Lymphocytes Relative: 24.9 % (ref 12.0–46.0)
Lymphs Abs: 1.6 10*3/uL (ref 0.7–4.0)
MCHC: 33.6 g/dL (ref 30.0–36.0)
MCV: 88.3 fl (ref 78.0–100.0)
Monocytes Absolute: 0.6 10*3/uL (ref 0.1–1.0)
Monocytes Relative: 9.8 % (ref 3.0–12.0)
Neutro Abs: 4 10*3/uL (ref 1.4–7.7)
Neutrophils Relative %: 62.8 % (ref 43.0–77.0)
Platelets: 224 10*3/uL (ref 150.0–400.0)
RBC: 5.12 Mil/uL (ref 4.22–5.81)
RDW: 14.1 % (ref 11.5–15.5)
WBC: 6.4 10*3/uL (ref 4.0–10.5)

## 2019-07-18 LAB — PSA: PSA: 0.93 ng/mL (ref 0.10–4.00)

## 2020-01-30 ENCOUNTER — Telehealth (INDEPENDENT_AMBULATORY_CARE_PROVIDER_SITE_OTHER): Payer: 59 | Admitting: Family Medicine

## 2020-01-30 ENCOUNTER — Encounter: Payer: Self-pay | Admitting: Family Medicine

## 2020-01-30 DIAGNOSIS — G4731 Primary central sleep apnea: Secondary | ICD-10-CM | POA: Diagnosis not present

## 2020-01-30 NOTE — Progress Notes (Signed)
Message received: "Thank you, will process.   Jessica "  

## 2020-01-30 NOTE — Progress Notes (Signed)
Community message sent to [place] with [facility] regarding new CPAP orders.

## 2020-01-30 NOTE — Progress Notes (Signed)
PATIENT: Timothy Ho DOB: May 31, 1965  REASON FOR VISIT: follow up HISTORY FROM: patient  Virtual Visit via Telephone Note  I connected with Timothy Ho on 01/30/20 at  8:00 AM EST by telephone and verified that I am speaking with the correct person using two identifiers.   I discussed the limitations, risks, security and privacy concerns of performing an evaluation and management service by telephone and the availability of in person appointments. I also discussed with the patient that there may be a patient responsible charge related to this service. The patient expressed understanding and agreed to proceed.   History of Present Illness:  01/30/20  Timothy Ho returns today for follow-up of complex sleep apnea on CPAP therapy.  He continues to do well.  He is using CPAP nightly. He does note improvement in sleep quality and energy levels. He denies concerns today.   Compliance report dated 01/30/2019 through 01/28/2020 reveals that he used CPAP 30 of the past 30 days for compliance of 100%.  He used CPAP greater than 4 hours all 30 days for compliance of 100%.  Average usage was 7 hours and 30 minutes.  Residual AHI was 0.7 on 4 to 8 cm of water and an EPR of 3.  There was no significant leak noted.   01/30/2019 ALL: Timothy Ho is a 55 y.o. 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  He continues to do well on CPAP therapy.  He reports consistent use.  Compliance report dated 12/27/2018 through 01/25/2019 reveals that he has used CPAP nightly for compliance of 100%.  He has used CPAP greater than 4 hours every night.  Average usage was 7 hours and 41 minutes.  Residual AHI was 0.8 on 4 to 8 cm of water and an EPR of 3.  There was no significant leak noted.  History (copied from [employer] note on 01/06/2018)   Timothy Ho presents today for Cpap follow up. He is compliant with CPAP usage as download shows usage of 30/30 days for 100% compliance. He used  his machine for > 4 hours 30/30 days for 100% compliance. On average he uses his CPAP 8 hours and 2 minutes per day. His residual AHI is 1.3  on 4-8 cmH2O with EPR of 3. There is no leak. He reports feeling less fatigued in the mornings. He is here today for an evaluation.   HISTORY (Copied from Dr.Dohmeier's note)  Timothy Ho a 55 y.o.malepatient seen on 9-18-2019seen here as in a referral from Dr. Lowella Bandy a sleep evaluation.  Chief complaint according to patient :Son noticed I have the pleasure meeting Timothy Ho today, who brought his spouse-another patient of mine-today to the meeting. His main concern has been that family members noticed him snoring,  but just last week he underwent a colonoscopy and was made aware by the nurse that he snored andpausedin his breathing.   Sleep habits are as follows:He introduced exercises on week ends and feels almost a high after it. He feels wound up but is able to sleep about 2 hours later. Watches Tv , goes to bed by 11.30- and asleep by midnight. Some nights he uses Zyquil. The bedroom is cool, quiet and dark.  He will sleep for 6 hours, rarely has nocturia. He sleeps promptly and his wife noted crescendo breathing. He sleeps on his left side. He uses 2 pillows for head support, flat bed.He may have one bathroom break at night  but usually has no difficulties to go back to sleep. Once he wakes up in the morning at about 6 AM he is not refreshed and restored and feels that he would crave more sleep.  Sleep medical history and family sleep history:Brother Genevie Cheshire has apnea. Brother Reita Cliche has insomnia.  Social history:married, Psychiatric nurse at [employer], 2 children - 33 and 85 years old.  Non smoker, ETOH- socially 1-2/ week. Caffeine: 2-4 cups in AM of coffee, none after lunch. No iced tea , but soda at dinner.    Observations/Objective:  Generalized: Well developed, in no acute distress  Mentation: Alert oriented to  time, place, history taking. Follows all commands speech and language fluent   Assessment and Plan:  55 y.o. year old male  has a past medical history of Chronic rhinitis, H/O: pneumonia (2013), and Sleep apnea. here with  No diagnosis found.   Jarren is doing very well on CPAP therapy.  Compliance report reveals excellent compliance.  He was encouraged to continue using CPAP nightly and greater than 4 hours each night. Supply orders sent to DME today. Healthy lifestyle habits encouraged. He will follow up in 1 year, sooner you have he verbalizes understanding and agreement with this plan.   No orders of the defined types were placed in this encounter.   No orders of the defined types were placed in this encounter.     Follow Up Instructions:  I discussed the assessment and treatment plan with the patient. The patient was provided an opportunity to ask questions and all were answered. The patient agreed with the plan and demonstrated an understanding of the instructions.   The patient was advised to call back or seek an in-person evaluation if the symptoms worsen or if the condition fails to improve as anticipated.  I provided 15 minutes of non-face-to-face time during this encounter.  Patient is located at his place of residence during my chart visit.  Providers in the office.   Shawnie Dapper, NP

## 2020-04-09 DIAGNOSIS — G4733 Obstructive sleep apnea (adult) (pediatric): Secondary | ICD-10-CM | POA: Diagnosis not present

## 2020-05-22 ENCOUNTER — Telehealth: Payer: 59 | Admitting: Emergency Medicine

## 2020-05-22 DIAGNOSIS — L089 Local infection of the skin and subcutaneous tissue, unspecified: Secondary | ICD-10-CM

## 2020-05-22 DIAGNOSIS — S81811A Laceration without foreign body, right lower leg, initial encounter: Secondary | ICD-10-CM

## 2020-05-22 MED ORDER — AMOXICILLIN-POT CLAVULANATE 875-125 MG PO TABS: 1.0000 | ORAL_TABLET | Freq: Two times a day (BID) | ORAL | 0 refills | Status: DC

## 2020-05-22 NOTE — Progress Notes (Signed)
E visit for Simple Cut/Laceration  Thank you for the additional information.  We are sorry that you have had an injury. Here is how we plan to help!  Based on what you shared with me it looks like you have a simple laceration that does not need to be repaired with stitches or tissue glue but it does appear infected so I have prescribed Augmentin, 875 mg by mouth twice daily for seven days.  Please check on the status of your tetanus vaccine, either in your own medical chart or by calling your primary care provider.   If you cannot find this information, or it has been over 5 years since your last tetanus vaccine, please schedule a nurse visit at your primary care office, urgent care, or local pharmacy for an updated vaccine.   HOME CARE: . Clean the cut or scrape - Wash it well with soap and water. * avoid using hydrogen peroxide which may cause tissue damage, or impede wound healing.  . Stop the bleeding - If your cut or scrape is bleeding, press a clean cloth or bandage firmly on the area for 20 minutes. You can also help slow the bleeding by holding the cut above the level of your heart.   . Put a thin layer of Bacitracin antibiotic ointment on the cut or scrape. (this can be purchased at any local pharmacy- ask your pharmacist if you need assistance)   . Cover the cut or scrape with a bandage or gauze. Keep the bandage clean and dry. Change the bandage 1 to 2 times every day until your cut or scrape heals.   . Watch for signs that your cut or scrape is infected (redness, drainage, pain, warmth, swelling or fever)  Over the next 48 hours your wound should start to improve with less pain, less swelling and less redness. If you should develop increasing pain, swelling, redness, fever, pus from the wound you should be seen immediately to make sure this is not becoming infected.   WOUND CARE: Please keep a layer of antibiotic ointment (bacitracin preferred) on this wound at least twice a day  for the next seven days and keep a sterile dressing over top of it. You may gently clean the wound with warm soap and water between dressing changes.  We strongly recommend that you have a medical provider reevaluate your wound within 2 to 3 days in person to make sure that it is healing appropriately.  Thank you for choosing an e-visit. Your e-visit answers were reviewed by a board certified advanced clinical practitioner to complete your personal care plan. Depending upon the condition, your plan could have included both over the counter or prescription medications. Please review your pharmacy choice. Make sure the pharmacy is open so you can pick up prescription now. If there is a problem, you may contact your provider through [employer] and have the prescription routed to another pharmacy. Your safety is important to [place]. If you have drug allergies check your prescription carefully.  For the next 24 hours you can use MyChart to ask questions about today's visit, request a non-urgent call back, or ask for a work or school excuse.  You will get an email with a link to our survey asking about your experience. I hope that your e-visit has been valuable and will speed your recovery     Greater than 5 minutes, yet less than 10 minutes of time have been spent researching, coordinating, and implementing care for this  patient today.

## 2020-09-26 DIAGNOSIS — G4733 Obstructive sleep apnea (adult) (pediatric): Secondary | ICD-10-CM | POA: Diagnosis not present

## 2020-10-08 DIAGNOSIS — H524 Presbyopia: Secondary | ICD-10-CM | POA: Diagnosis not present

## 2020-12-19 DIAGNOSIS — G4733 Obstructive sleep apnea (adult) (pediatric): Secondary | ICD-10-CM | POA: Diagnosis not present

## 2021-02-03 NOTE — Patient Instructions (Addendum)
Please continue using your CPAP regularly. While your insurance requires that you use CPAP at least 4 hours each night on 70% of the nights, I recommend, that you not skip any nights and use it throughout the night if you can. Getting used to CPAP and staying with the treatment long term does take time and patience and discipline. Untreated obstructive sleep apnea when it is moderate to severe can have an adverse impact on cardiovascular health and raise her risk for heart disease, arrhythmias, hypertension, congestive heart failure, stroke and diabetes. Untreated obstructive sleep apnea causes sleep disruption, nonrestorative sleep, and sleep deprivation. This can have an impact on your day to day functioning and cause daytime sleepiness and impairment of cognitive function, memory loss, mood disturbance, and problems focussing. Using CPAP regularly can improve these symptoms.  Reach out to [facility] to inquire about the replacement door for your CPAP. Please let me know if you have any trouble!  DME: [facility] Phone: [phone], press option 1 Fax: [phone]  It was a pleasure to see you, again!   Follow up in 1 year

## 2021-02-03 NOTE — Progress Notes (Signed)
Timothy [name]: Timothy Timothy [name] DOB: 04/20/1965  REASON FOR VISIT: follow up HISTORY FROM: Timothy [name]  Virtual Visit via Telephone Note  I connected with Timothy Timothy [name] on 02/04/21 at  8:00 AM EST by telephone and verified that I am speaking with the correct person using two identifiers.   I discussed the limitations, risks, security and privacy concerns of performing an evaluation and management service by telephone and the availability of in person appointments. I also discussed with the Timothy [name] that there may be a Timothy [name] charge related to this service. The Timothy [name] expressed understanding and agreed to proceed.   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 He continues to do very well. He is using CPAP nightly. No concerns with supplies. He does report side door of machine is broken and wishes to contact DME for replacement part. Otherwise he is doing very well and without concerns.     01/30/2020 ALL (Mychart): Timothy Timothy [name] for follow-up of complex sleep apnea on CPAP therapy.  He continues to do well.  He is using CPAP nightly. He does note improvement in sleep quality and energy levels. He denies concerns Timothy [name].   Compliance report dated 01/30/2019 through 01/28/2020 reveals that he used CPAP 30 of the past 30 days for compliance of 100%.  He used CPAP greater than 4 hours all 30 days for compliance of 100%.  Average usage was 7 hours and 30 minutes.  Residual AHI was 0.7 on 4 to 8 cm of water and an EPR of 3.  There was no significant leak noted.  01/30/2019 ALL: Timothy Timothy [name] is a 56 y.o. 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  He continues to do well on CPAP therapy.  He reports consistent use.  Compliance report dated 12/27/2018 through 01/25/2019 reveals that he has used CPAP nightly for compliance of 100%.  He has used CPAP greater than 4 hours every night.  Average  usage was 7 hours and 41 minutes.  Residual AHI was 0.8 on 4 to 8 cm of water and an EPR of 3.  There was no significant leak noted.  History (copied from [employer] note on 01/06/2018)   Timothy Timothy [name] presents Timothy [name] for Cpap follow up. He is compliant with CPAP usage as download shows usage of 30/30 days for 100% compliance. He used his machine for > 4 hours 30/30 days for 100% compliance. On average he uses his CPAP 8 hours and 2 minutes per day. His residual AHI is 1.3  on 4-8 cmH2O with EPR of 3. There is no leak. He reports feeling less fatigued in the mornings. He is here Timothy [name] for an evaluation.    HISTORY (Copied from Dr.Dohmeier's note)  Timothy Timothy [name] is a 56 y.o. Timothy [name] Timothy [name] seen on 09-22-2017 seen here as in a referral from Dr. Carmelia Roller for a sleep evaluation.   Chief complaint according to Timothy [name] : Son noticed I have the pleasure meeting Timothy Timothy [name] Timothy [name], who brought his spouse -another Timothy [name] of mine- Timothy [name] to the meeting.  His main concern has been that family members noticed him snoring,  but just last week he underwent a colonoscopy and was made aware by the nurse that he snored and paused in his breathing.   Sleep habits are as follows:  He introduced exercises on week ends and feels almost a high after it. He feels wound up  but is able to sleep about 2 hours later. Watches Tv , goes to bed by 11.30- and asleep by midnight. Some nights he uses Zyquil. The bedroom is cool, quiet and dark.  He will sleep for 6 hours, rarely has nocturia. He sleeps promptly and his wife noted crescendo breathing. He sleeps on his left side. He uses 2 pillows for head support, flat bed. He may have one bathroom break at night but usually has no difficulties to go back to sleep.  Once he wakes up in the morning at about 6 AM he is not refreshed and restored and feels that he would crave more sleep.   Sleep medical history and family sleep history: Brother Timothy Timothy [name] has apnea. Brother Timothy Timothy [name]  has insomnia.    Social history: married, Psychiatric nurseT manager at [employer], 2 children - 5020 and 58 years old.  Non smoker, ETOH- socially 1-2/ week. Caffeine: 2-4 cups in AM of coffee, none after lunch. No iced tea , but soda at dinner.    Observations/Objective:  Generalized: Well developed, in no acute distress  Mentation: Alert oriented to time, place, history taking. Follows all commands speech and language fluent   Assessment and Plan:  56 y.o. year old Timothy [name]  has a past medical history of Chronic rhinitis, H/O: pneumonia (2013), and Sleep apnea. here with    ICD-10-CM   1. 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 Sleep apnea with use of continuous positive airway pressure (CPAP)  G47.30        Timothy Timothy [name] is doing very well on CPAP therapy.  Compliance report reveals excellent compliance.  He was encouraged to continue using CPAP nightly and greater than 4 hours each night. Supply orders sent to DME Timothy [name]. I have given him the contact number for the local [facility] office to inquire about replacement part for CPAP. Healthy lifestyle habits encouraged. He will follow up in 1 year, sooner you have he verbalizes understanding and agreement with this plan.   Orders Placed This Encounter  Procedures   For home use only DME continuous positive airway pressure (CPAP)    Supplies    Order Specific Question:   Length of Need    Answer:   Lifetime    Order Specific Question:   Timothy [name] has OSA or probable OSA    Answer:   Yes    Order Specific Question:   Is the Timothy [name] currently using CPAP in the home    Answer:   Yes    Order Specific Question:   Settings    Answer:   Other see comments    Order Specific Question:   CPAP supplies needed    Answer:   Mask, headgear, cushions, filters, heated tubing and water chamber    No orders of the defined types were placed in this encounter.     Follow Up Instructions:  I discussed the  assessment and treatment plan with the Timothy [name]. The Timothy [name] was provided an opportunity to ask questions and all were answered. The Timothy [name] agreed with the plan and demonstrated an understanding of the instructions.   The Timothy [name] was advised to call back or seek an in-person evaluation if the symptoms worsen or if the condition fails to improve as anticipated.  I provided 15 minutes of non-face-to-face time during this encounter.  Timothy [name] is located at his place of residence during my chart visit.  Providers in the office.   Shital Crayton  Arriona Prest, NP

## 2021-02-04 ENCOUNTER — Telehealth (INDEPENDENT_AMBULATORY_CARE_PROVIDER_SITE_OTHER): Payer: 59 | Admitting: Family Medicine

## 2021-02-04 ENCOUNTER — Encounter: Payer: Self-pay | Admitting: Family Medicine

## 2021-02-04 DIAGNOSIS — G4730 Sleep apnea, unspecified: Secondary | ICD-10-CM

## 2021-02-04 DIAGNOSIS — G4731 Primary central sleep apnea: Secondary | ICD-10-CM

## 2021-02-04 NOTE — Progress Notes (Signed)
CM sent to [facility] for new orders.  °

## 2021-02-19 ENCOUNTER — Telehealth: Payer: Self-pay | Admitting: Family Medicine

## 2021-02-19 NOTE — Telephone Encounter (Signed)
Called the patient. There was no answer. Left a VM advising that Timothy Ho wanted [place] to touch base and make sure that he was able to work with the DME to get his machine checked in on. I asked the patient to update [place] on this matter. I also provided the DME phone number in case had not been addressed and he needed to call them.

## 2021-02-19 NOTE — Telephone Encounter (Signed)
Can you please make sure he was able to get in contact with DME for the replacement door that was broken on his CPAP? TY!

## 2021-04-04 ENCOUNTER — Telehealth: Payer: 59 | Admitting: Physician Assistant

## 2021-04-04 DIAGNOSIS — S91301A Unspecified open wound, right foot, initial encounter: Secondary | ICD-10-CM

## 2021-04-04 MED ORDER — SULFAMETHOXAZOLE-TRIMETHOPRIM 800-160 MG PO TABS: 1.0000 | ORAL_TABLET | Freq: Two times a day (BID) | ORAL | 0 refills | Status: DC

## 2021-04-04 NOTE — Progress Notes (Signed)
E Visit for Cellulitis ? ?We are sorry that you are not feeling well. Here is how we plan to help! ? ?Based on what you shared with me it looks like you have cellulitis.  Cellulitis looks like areas of skin redness, swelling, and warmth; it develops as a result of bacteria entering under the skin. Little red spots and/or bleeding can be seen in skin, and tiny surface sacs containing fluid can occur. Fever can be present. Cellulitis is almost always on one side of a body, and the lower limbs are the most common site of involvement. This appears to be wound cellulitis around the blister. ? ?I have prescribed:  Bactrim DS 1 tablet by mouth twice a day for 7 days ? ?HOME CARE: ? ?Take your medications as ordered and take all of them, even if the skin irritation appears to be healing.  ? ?GET HELP RIGHT AWAY IF: ? ?Symptoms that don't begin to go away within 48 hours. ?Severe redness persists or worsens ?If the area turns color, spreads or swells. ?If it blisters and opens, develops yellow-brown crust or bleeds. ?You develop a fever or chills. ?If the pain increases or becomes unbearable.  ?Are unable to keep fluids and food down. ? ?MAKE SURE YOU  ? ?Understand these instructions. ?Will watch your condition. ?Will get help right away if you are not doing well or get worse. ? ?Thank you for choosing an e-visit. ? ?Your e-visit answers were reviewed by a board certified advanced clinical practitioner to complete your personal care plan. Depending upon the condition, your plan could have included both over the counter or prescription medications. ? ?Please review your pharmacy choice. Make sure the pharmacy is open so you can pick up prescription now. If there is a problem, you may contact your provider through [employer] and have the prescription routed to another pharmacy.  Your safety is important to [place]. If you have drug allergies check your prescription carefully.  ? ?For the next 24 hours you can use MyChart to  ask questions about today's visit, request a non-urgent call back, or ask for a work or school excuse. ?You will get an email in the next two days asking about your experience. I hope that your e-visit has been valuable and will speed your recovery. ? ? ?I provided 5 minutes of non face-to-face time during this encounter for chart review and documentation.  ? ?

## 2021-07-04 DIAGNOSIS — G4733 Obstructive sleep apnea (adult) (pediatric): Secondary | ICD-10-CM | POA: Diagnosis not present

## 2021-07-25 ENCOUNTER — Encounter: Payer: Self-pay | Admitting: Family Medicine

## 2021-07-25 ENCOUNTER — Other Ambulatory Visit (INDEPENDENT_AMBULATORY_CARE_PROVIDER_SITE_OTHER): Payer: 59

## 2021-07-25 ENCOUNTER — Other Ambulatory Visit: Payer: Self-pay | Admitting: Family Medicine

## 2021-07-25 ENCOUNTER — Ambulatory Visit: Payer: 59 | Admitting: Medical

## 2021-07-25 ENCOUNTER — Ambulatory Visit (INDEPENDENT_AMBULATORY_CARE_PROVIDER_SITE_OTHER): Payer: 59 | Admitting: Family Medicine

## 2021-07-25 VITALS — BP 130/82 | HR 76 | Temp 98.6°F | Ht 77.0 in | Wt 383.1 lb

## 2021-07-25 DIAGNOSIS — Z125 Encounter for screening for malignant neoplasm of prostate: Secondary | ICD-10-CM

## 2021-07-25 DIAGNOSIS — Z0000 Encounter for general adult medical examination without abnormal findings: Secondary | ICD-10-CM

## 2021-07-25 DIAGNOSIS — R739 Hyperglycemia, unspecified: Secondary | ICD-10-CM | POA: Diagnosis not present

## 2021-07-25 DIAGNOSIS — Z87898 Personal history of other specified conditions: Secondary | ICD-10-CM | POA: Insufficient documentation

## 2021-07-25 LAB — LIPID PANEL
Cholesterol: 180 mg/dL (ref 0–200)
HDL: 37.3 mg/dL — ABNORMAL LOW (ref >39.00)
LDL Cholesterol: 128 mg/dL — ABNORMAL HIGH (ref 0–99)
NonHDL: 143.18
Total CHOL/HDL Ratio: 5
Triglycerides: 75 mg/dL (ref 0.0–149.0)
VLDL: 15 mg/dL (ref 0.0–40.0)

## 2021-07-25 LAB — COMPREHENSIVE METABOLIC PANEL
ALT: 34 U/L (ref 0–53)
AST: 21 U/L (ref 0–37)
Albumin: 4.4 g/dL (ref 3.5–5.2)
Alkaline Phosphatase: 89 U/L (ref 39–117)
BUN: 13 mg/dL (ref 6–23)
CO2: 30 mEq/L (ref 19–32)
Calcium: 9.2 mg/dL (ref 8.4–10.5)
Chloride: 105 mEq/L (ref 96–112)
Creatinine, Ser: 0.88 mg/dL (ref 0.40–1.50)
GFR: 94.85 mL/min (ref >60.00)
Glucose, Bld: 104 mg/dL — ABNORMAL HIGH (ref 70–99)
Potassium: 4.4 mEq/L (ref 3.5–5.1)
Sodium: 141 mEq/L (ref 135–145)
Total Bilirubin: 0.6 mg/dL (ref 0.2–1.2)
Total Protein: 6.9 g/dL (ref 6.0–8.3)

## 2021-07-25 LAB — CBC
HCT: 45.2 % (ref 39.0–52.0)
Hemoglobin: 15 g/dL (ref 13.0–17.0)
MCHC: 33.2 g/dL (ref 30.0–36.0)
MCV: 88.4 fl (ref 78.0–100.0)
Platelets: 213 10*3/uL (ref 150.0–400.0)
RBC: 5.11 Mil/uL (ref 4.22–5.81)
RDW: 13.7 % (ref 11.5–15.5)
WBC: 5.5 10*3/uL (ref 4.0–10.5)

## 2021-07-25 LAB — HEMOGLOBIN A1C: Hgb A1c MFr Bld: 5.7 % (ref 4.6–6.5)

## 2021-07-25 LAB — PSA: PSA: 1.36 ng/mL (ref 0.10–4.00)

## 2021-07-25 NOTE — Patient Instructions (Signed)
Give us 2-3 business days to get the results of your labs back.   Keep the diet clean and stay active.  Aim to do some physical exertion for 150 minutes per week. This is typically divided into 5 days per week, 30 minutes per day. The activity should be enough to get your heart rate up. Anything is better than nothing if you have time constraints.  Please get me a copy of your advanced directive form at your convenience.   Let us know if you need anything.  

## 2021-07-25 NOTE — Progress Notes (Signed)
Chief Complaint  Patient presents with   Annual Exam    Well Male Timothy Ho is here for a complete physical.   His last physical was >1 year ago.  Current diet: in general, diet could be better Current exercise: intermittently will be lifting/doing cardio Weight trend: gained Fatigue out of ordinary? No. Seat belt? Yes.   Advanced directive? No  Health maintenance Shingrix- No Colonoscopy- Yes Tetanus- Yes HIV- Yes Hep C- Yes   Past Medical History:  Diagnosis Date   Chronic rhinitis    H/O: pneumonia 2013   Sleep apnea     Past Surgical History:  Procedure Laterality Date   COLONOSCOPY WITH PROPOFOL N/A 09/10/2017   Procedure: COLONOSCOPY WITH PROPOFOL;  Surgeon: Midge Minium, MD;  [facility];  Service: Endoscopy;  Laterality: N/A;   none      Medications  Takes no meds routinely.    Allergies No Known Allergies  Family History Family History  Problem Relation Age of Onset   Diabetes Mother    Hypertension Mother    Obesity Mother    Crohn's disease Sister    Lung disease Neg Hx    Rheumatologic disease Neg Hx    Cancer Neg Hx     Review of Systems: Constitutional:  no fevers Eye:  no recent significant change in vision Ear/Nose/Mouth/Throat:  Ears:  no hearing loss Nose/Mouth/Throat:  no complaints of nasal congestion, no sore throat Cardiovascular:  no chest pain Respiratory:  no shortness of breath Gastrointestinal:  no change in bowel habits GU:  Male: negative for dysuria, frequency Musculoskeletal/Extremities:  no joint pain Integumentary (Skin/Breast):  no abnormal skin lesions reported Neurologic:  no headaches Endocrine: No unexpected weight changes Hematologic/Lymphatic:  no abnormal bleeding  Exam BP 130/82   Pulse 76   Temp 98.6 F (37 C) (Oral)   Ht 6\' 5"  (1.956 m)   Wt (!) 383 lb 2 oz (173.8 kg)   SpO2 97%   BMI 45.43 kg/m  General:  well developed, well nourished, in no apparent distress Skin:  no  significant moles, warts, or growths Head:  no masses, lesions, or tenderness Eyes:  pupils equal and round, sclera anicteric without injection Ears:  canals without lesions, TMs shiny without retraction, no obvious effusion, no erythema Nose:  nares patent, septum midline, mucosa normal Throat/Pharynx:  lips and gingiva without lesion; tongue and uvula midline; non-inflamed pharynx; no exudates or postnasal drainage Neck: neck supple without adenopathy, thyromegaly, or masses Cardiac: RRR, no bruits, no LE edema Lungs:  clear to auscultation, breath sounds equal bilaterally, no respiratory distress Abdomen: BS+, soft, non-tender, non-distended, no masses or organomegaly noted Rectal: Deferred Musculoskeletal:  symmetrical muscle groups noted without atrophy or deformity Neuro:  gait normal; deep tendon reflexes normal and symmetric Psych: well oriented with normal range of affect and appropriate judgment/insight  Assessment and Plan  Well adult exam - Plan: CBC, Comprehensive metabolic panel, Lipid panel  Morbid obesity (HCC)  Screening for prostate cancer - Plan: PSA   Well 56 y.o. male. Counseled on diet and exercise. Counseled on risks and benefits of prostate cancer screening with PSA. The patient agrees to undergo testing. Advanced directive form provided today.  Immunizations, labs, and further orders as above. Follow up in 1 yr. The patient voiced understanding and agreement to the plan.  [address], DO 07/25/21 10:18 AM

## 2021-08-13 ENCOUNTER — Encounter (INDEPENDENT_AMBULATORY_CARE_PROVIDER_SITE_OTHER): Payer: Self-pay

## 2021-09-16 DIAGNOSIS — H524 Presbyopia: Secondary | ICD-10-CM | POA: Diagnosis not present

## 2021-11-05 ENCOUNTER — Telehealth: Payer: 59 | Admitting: Physician Assistant

## 2021-11-05 DIAGNOSIS — B356 Tinea cruris: Secondary | ICD-10-CM | POA: Diagnosis not present

## 2021-11-05 MED ORDER — NYSTATIN 100000 UNIT/GM EX CREA: 1.0000 | TOPICAL_CREAM | Freq: Two times a day (BID) | CUTANEOUS | 0 refills | Status: DC

## 2021-11-05 NOTE — Progress Notes (Signed)
E-Visit for [employer]  We are sorry that you are not feeling well. Here is how we plan to help!  Based on what you shared with me it looks like you have tinea cruris, or "Jock Itch".  The symptoms of Jock Itch include red, peeling, itchy rash that affects the groin (crease where the leg meets the trunk).  This fungal infection can be spread through shared towels, clothing, bedding, or hard surfaces (particularly in moist areas) such as shower stalls, locker room floors, or pool area that has the fungus present. If you have a fungal infection on one part of your body, you can also spread it to other parts. For instance, men with a fungal infection on their feet sometimes spread it to their groin.  I am recommending:Nystatin topical cream. Apply to affected area twice daily until resolved   HOME CARE:  Keep affected area clean, dry, and cool. Wash with soap and shampoo after sports or exercise and dry yourself well after bathing or swimming Wear cotton underwear and change them if they become damp or sweaty. Avoid using swimming pools, public showers, or baths.  GET HELP RIGHT AWAY IF:  Symptoms that don't away after treatment. Severe itching that persists. If your rash spreads or swells. If your rash begins to have drainage or smell. You develop a fever.  MAKE SURE YOU   Understand these instructions. Will watch your condition. Will get help right away if you are not doing well or get worse.  Thank you for choosing an e-visit.  Your e-visit answers were reviewed by a board certified advanced clinical practitioner to complete your personal care plan. Depending upon the condition, your plan could have included both over the counter or prescription medications.  Please review your pharmacy choice. Make sure the pharmacy is open so you can pick up prescription now. If there is a problem, you may contact your provider through [employer] and have the prescription routed to another  pharmacy.  Your safety is important to [place]. If you have drug allergies check your prescription carefully.   For the next 24 hours you can use MyChart to ask questions about today's visit, request a non-urgent call back, or ask for a work or school excuse. You will get an email in the next two days asking about your experience. I hope that your e-visit has been valuable and will speed your recovery.   References or for more information:  [employer] https://hebert-johnson.com/.html [employer]er.de?search=jock%20itch&source=search_result&selectedTitle=3~52&usage_type=default&display_rank=3   I have spent 5 minutes in review of e-visit questionnaire, review and updating patient chart, medical decision making and response to patient.   Mar Daring, PA-C

## 2021-12-26 DIAGNOSIS — G4733 Obstructive sleep apnea (adult) (pediatric): Secondary | ICD-10-CM | POA: Diagnosis not present

## 2022-01-01 ENCOUNTER — Ambulatory Visit: Payer: 59 | Admitting: Nurse Practitioner

## 2022-01-01 ENCOUNTER — Encounter: Payer: Self-pay | Admitting: Nurse Practitioner

## 2022-01-01 VITALS — BP 130/84 | HR 100 | Temp 98.0°F | Wt 388.4 lb

## 2022-01-01 DIAGNOSIS — L089 Local infection of the skin and subcutaneous tissue, unspecified: Secondary | ICD-10-CM

## 2022-01-01 DIAGNOSIS — L304 Erythema intertrigo: Secondary | ICD-10-CM

## 2022-01-01 MED ORDER — FLUCONAZOLE 150 MG PO TABS: 150.0000 mg | ORAL_TABLET | ORAL | 0 refills | Status: DC

## 2022-01-01 MED ORDER — CLOTRIMAZOLE-BETAMETHASONE 1-0.05 % EX CREA: 1.0000 | TOPICAL_CREAM | Freq: Every day | CUTANEOUS | 0 refills | Status: DC

## 2022-01-01 MED ORDER — CEPHALEXIN 500 MG PO CAPS: 500.0000 mg | ORAL_CAPSULE | Freq: Three times a day (TID) | ORAL | 0 refills | Status: DC

## 2022-01-01 NOTE — Patient Instructions (Signed)
It was great to see you!  Start keflex 3 times a day with food for 10 days (antibiotic)  Start diflucan once a week for 4 weeks.  Start clotrimazole cream daily to your back.  Make sure that you are completely drying yourself off after showers.   Let's follow-up if your symptoms worsen or don't improve.   Take care,  Rodman Pickle, NP

## 2022-01-01 NOTE — Progress Notes (Signed)
Acute Office Visit  Subjective:     Patient ID: Timothy Ho, male    DOB: October 24, 1965, 56 y.o.   MRN: [id number]  Chief Complaint  Patient presents with   Rash    Rash on back x lower back. Nail on right third toe infection.    HPI:  Patient is in today for a rash to his lower back that has been there for several weeks.  He states that it is red, scaly, however he does not have any drainage.  He has been putting on some peroxide.  He takes showers twice a day.  He states that it feels raw and slightly burning.  It has gotten slightly worse over the last few days.  He denies fevers.  He also noticed that for the last few days, his right fourth toe had a hangnail which she pulled off.  Since then it has been red and draining pus.  He has also been using hydrogen peroxide on this along with Neosporin.  He states that it is not getting any better.  He denies fevers.  ROS See pertinent positives and negatives per HPI.     Objective:    BP 130/84 (BP Location: Left Arm, Patient Position: Sitting, Cuff Size: Large)   Pulse 100   Temp 98 F (36.7 C) (Temporal)   Wt (!) 388 lb 6.4 oz (176.2 kg)   SpO2 93%   BMI 46.06 kg/m    Physical Exam Vitals and nursing note reviewed.  Constitutional:      Appearance: Normal appearance.  HENT:     Head: Normocephalic.  Eyes:     Conjunctiva/sclera: Conjunctivae normal.  Pulmonary:     Effort: Pulmonary effort is normal.  Musculoskeletal:     Cervical back: Normal range of motion.  Skin:    General: Skin is warm.     Findings: Rash present.     Comments: Dry, scaly rash to midline lumbar spine. Redness, swelling to 4th toe on right foot.  Neurological:     General: No focal deficit present.     Mental Status: He is alert and oriented to person, place, and time.  Psychiatric:        Mood and Affect: Mood normal.        Behavior: Behavior normal.        Thought Content: Thought content normal.        Judgment: Judgment normal.       Assessment & Plan:   Problem List Items Addressed This Visit       Musculoskeletal and Integument   Intertrigo    Noted to midline lumbar spine.  Areas have been worsening over the past few weeks.  Will treat with clotrimazole cream and Diflucan once a week for 4 weeks.  Discussed keeping the area dry and making sure he dries off after showering.  Stop using hydrogen peroxide on the area.  His last A1c was 5.7% in July 2023.  Follow-up with PCP if symptoms worsen or do not prove.      Other Visit Diagnoses     Skin infection    -  Primary   To 4th toe on right foot. With redness, swelling, drainage will start on keflex 500mg  TID x10 days. Can soak in epsom salts and wash with warm soap water   Relevant Medications   cephALEXin (KEFLEX) 500 MG capsule   fluconazole (DIFLUCAN) 150 MG tablet   clotrimazole-betamethasone (LOTRISONE) cream  Meds ordered this encounter  Medications   cephALEXin (KEFLEX) 500 MG capsule    Sig: Take 1 capsule (500 mg total) by mouth 3 (three) times daily.    Dispense:  30 capsule    Refill:  0   fluconazole (DIFLUCAN) 150 MG tablet    Sig: Take 1 tablet (150 mg total) by mouth once a week.    Dispense:  4 tablet    Refill:  0   clotrimazole-betamethasone (LOTRISONE) cream    Sig: Apply 1 Application topically daily.    Dispense:  30 g    Refill:  0    Return if symptoms worsen or fail to improve.  Gerre Scull, NP

## 2022-01-01 NOTE — Assessment & Plan Note (Signed)
Noted to midline lumbar spine.  Areas have been worsening over the past few weeks.  Will treat with clotrimazole cream and Diflucan once a week for 4 weeks.  Discussed keeping the area dry and making sure he dries off after showering.  Stop using hydrogen peroxide on the area.  His last A1c was 5.7% in July 2023.  Follow-up with PCP if symptoms worsen or do not prove.

## 2022-02-12 NOTE — Progress Notes (Signed)
PATIENT: Timothy Ho DOB: 1965-12-02  REASON FOR VISIT: follow up HISTORY FROM: patient  Virtual Visit via Telephone Note  I connected with Timothy Ho on 02/17/22 at  8:00 AM EST by telephone and verified that I am speaking with the correct person using two identifiers.   I discussed the limitations, risks, security and privacy concerns of performing an evaluation and management service by telephone and the availability of in person appointments. I also discussed with the patient that there may be a patient responsible charge related to this service. The patient expressed understanding and agreed to proceed.   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 He is doing well. He denies concerns with machine. He was set up in 11/2017. He does have some skin irritation where mask strap hits side of face. He is using a barrier cloth.     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 He continues to do very well. He is using CPAP nightly. No concerns with supplies. He does report side door of machine is broken and wishes to contact DME for replacement part. Otherwise he is doing very well and without concerns.     01/30/2020 ALL (Mychart): Timothy Ho returns today for follow-up of complex sleep apnea on CPAP therapy.  He continues to do well.  He is using CPAP nightly. He does note improvement in sleep quality and energy levels. He denies concerns today.   Compliance report dated 01/30/2019 through 01/28/2020 reveals that he used CPAP 30 of the past 30 days for compliance of 100%.  He used CPAP greater than 4 hours all 30 days for compliance of 100%.  Average usage was 7 hours and 30 minutes.  Residual AHI was 0.7 on 4 to 8 cm of water and an EPR of 3.  There was no significant leak noted.  01/30/2019 ALL: Timothy Ho is a 57 y.o. 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  He continues to do well on CPAP therapy.  He reports consistent use.  Compliance report dated 12/27/2018 through 01/25/2019 reveals that he has used CPAP nightly for compliance of 100%.  He has used CPAP greater than 4 hours every night.  Average usage was 7 hours and 41 minutes.  Residual AHI was 0.8 on 4 to 8 cm of water and an EPR of 3.  There was no significant leak noted.  History (copied from [employer] note on 01/06/2018)   Timothy Ho presents today for Cpap follow up. He is compliant with CPAP usage as download shows usage of 30/30 days for 100% compliance. He used his machine for > 4 hours 30/30 days for 100% compliance. On average he uses his CPAP 8 hours and 2 minutes per day. His residual AHI is 1.3  on 4-8 cmH2O with EPR of 3. There is no leak. He reports feeling less fatigued in the mornings. He is here today for an evaluation.    HISTORY (Copied from Dr.Dohmeier's note)  Timothy Ho is a 57 y.o. male patient seen on 09-22-2017 seen here as in a referral from Dr. Nani Ravens for a sleep evaluation.   Chief complaint according to patient : Son noticed I have the pleasure meeting Timothy Ho today, who brought his spouse -another patient of mine- today to the meeting.  His main concern has been that family members  noticed him snoring,  but just last week he underwent a colonoscopy and was made aware by the nurse that he snored and paused in his breathing.   Sleep habits are as follows:  He introduced exercises on week ends and feels almost a high after it. He feels wound up but is able to sleep about 2 hours later. Watches Tv , goes to bed by 11.30- and asleep by midnight. Some nights he uses Zyquil. The bedroom is cool, quiet and dark.  He will sleep for 6 hours, rarely has nocturia. He sleeps promptly and his wife noted crescendo breathing. He sleeps on his left side. He uses 2 pillows for head support, flat bed. He may have one bathroom break  at night but usually has no difficulties to go back to sleep.  Once he wakes up in the morning at about 6 AM he is not refreshed and restored and feels that he would crave more sleep.   Sleep medical history and family sleep history: Brother Timothy Ho has apnea. Brother Timothy Ho has insomnia.    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  Non smoker, ETOH- socially 1-2/ week. Caffeine: 2-4 cups in AM of coffee, none after lunch. No iced tea , but soda at dinner.    Observations/Objective:  Generalized: Well developed, in no acute distress  Mentation: Alert oriented to time, place, history taking. Follows all commands speech and language fluent   Assessment and Plan:  57 y.o. year old male  has a past medical history of Chronic rhinitis, H/O: pneumonia (2013), and Sleep apnea. here with    ICD-10-CM   1. 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 Sleep apnea with use of continuous positive airway pressure (CPAP)  G47.30 For home use only DME continuous positive airway pressure (CPAP)       Timothy Ho is doing very well on CPAP therapy.  Compliance report reveals excellent compliance.  He was encouraged to continue using CPAP nightly and greater than 4 hours each night. Supply orders sent to DME today. We will discuss getting new CPAP toward the end of this year. Healthy lifestyle habits encouraged. He will follow up in 1 year, sooner you have he verbalizes understanding and agreement with this plan.   Orders Placed This Encounter  Procedures   For home use only DME continuous positive airway pressure (CPAP)    Supplies    Order Specific Question:   Length of Need    Answer:   Lifetime    Order Specific Question:   Patient has OSA or probable OSA    Answer:   Yes    Order Specific Question:   Is the patient currently using CPAP in the home    Answer:   Yes    Order Specific Question:   Settings    Answer:   Other see comments    Order Specific  Question:   CPAP supplies needed    Answer:   Mask, headgear, cushions, filters, heated tubing and water chamber    No orders of the defined types were placed in this encounter.     Follow Up Instructions:  I discussed the assessment and treatment plan with the patient. The patient was provided an opportunity to ask questions and all were answered. The patient agreed with the plan and demonstrated an understanding of the instructions.   The patient was advised to call back or seek an in-person evaluation  if the symptoms worsen or if the condition fails to improve as anticipated.  I provided 15 minutes of non-face-to-face time during this encounter.  Patient is located at his place of residence during my chart visit.  Providers in the office.   Debbora Presto, NP

## 2022-02-12 NOTE — Patient Instructions (Addendum)
Please continue using your CPAP regularly. While your insurance requires that you use CPAP at least 4 hours each night on 70% of the nights, I recommend, that you not skip any nights and use it throughout the night if you can. Getting used to CPAP and staying with the treatment long term does take time and patience and discipline. Untreated obstructive sleep apnea when it is moderate to severe can have an adverse impact on cardiovascular health and raise her risk for heart disease, arrhythmias, hypertension, congestive heart failure, stroke and diabetes. Untreated obstructive sleep apnea causes sleep disruption, nonrestorative sleep, and sleep deprivation. This can have an impact on your day to day functioning and cause daytime sleepiness and impairment of cognitive function, memory loss, mood disturbance, and problems focussing. Using CPAP regularly can improve these symptoms.  We will discuss getting a new machine toward the end of this year. Let me know if you need me.   Follow up in 1 year

## 2022-02-17 ENCOUNTER — Encounter: Payer: Self-pay | Admitting: Family Medicine

## 2022-02-17 ENCOUNTER — Telehealth: Payer: Commercial Managed Care - PPO | Admitting: Physician Assistant

## 2022-02-17 ENCOUNTER — Telehealth: Payer: Commercial Managed Care - PPO | Admitting: Family Medicine

## 2022-02-17 DIAGNOSIS — J069 Acute upper respiratory infection, unspecified: Secondary | ICD-10-CM | POA: Diagnosis not present

## 2022-02-17 DIAGNOSIS — G4730 Sleep apnea, unspecified: Secondary | ICD-10-CM | POA: Diagnosis not present

## 2022-02-17 DIAGNOSIS — G4731 Primary central sleep apnea: Secondary | ICD-10-CM

## 2022-02-17 MED ORDER — BENZONATATE 100 MG PO CAPS: 100.0000 mg | ORAL_CAPSULE | Freq: Three times a day (TID) | ORAL | 0 refills | Status: DC | PRN

## 2022-02-17 MED ORDER — FLUTICASONE PROPIONATE 50 MCG/ACT NA SUSP: 2.0000 | Freq: Every day | NASAL | 0 refills | Status: DC

## 2022-02-17 NOTE — Progress Notes (Signed)
E-Visit for Upper Respiratory Infection   We are sorry you are not feeling well.  Here is how we plan to help!  Based on what you have shared with me, it looks like you may have a viral upper respiratory infection.  This could be influenza but with symptoms initially presenting last week we are outside the time window for antiviral medications. This is also why I recommend a COVID test, just to be cautious. Upper respiratory infections are caused by a large number of viruses; however, rhinovirus is the most common cause.   Symptoms vary from person to person, with common symptoms including sore throat, cough, fatigue or lack of energy and feeling of general discomfort.  A low-grade fever of up to 100.4 may present, but is often uncommon.  Symptoms vary however, and are closely related to a person's age or underlying illnesses.  The most common symptoms associated with an upper respiratory infection are nasal discharge or congestion, cough, sneezing, headache and pressure in the ears and face.  These symptoms usually persist for about 3 to 10 days, but can last up to 2 weeks.  It is important to know that upper respiratory infections do not cause serious illness or complications in most cases.    Upper respiratory infections can be transmitted from person to person, with the most common method of transmission being a person's hands.  The virus is able to live on the skin and can infect other persons for up to 2 hours after direct contact.  Also, these can be transmitted when someone coughs or sneezes; thus, it is important to cover the mouth to reduce this risk.  To keep the spread of the illness at [place], good hand hygiene is very important.  This is an infection that is most likely caused by a virus. There are no specific treatments other than to help you with the symptoms until the infection runs its course.  We are sorry you are not feeling well.  Here is how we plan to help!   For nasal congestion,  you may use an oral decongestants such as Mucinex D or if you have glaucoma or high blood pressure use plain Mucinex.  Saline nasal spray or nasal drops can help and can safely be used as often as needed for congestion.  For your congestion, I have prescribed Fluticasone nasal spray one spray in each nostril twice a day  If you do not have a history of heart disease, hypertension, diabetes or thyroid disease, prostate/bladder issues or glaucoma, you may also use Sudafed to treat nasal congestion.  It is highly recommended that you consult with a pharmacist or your primary care physician to ensure this medication is safe for you to take.     If you have a cough, you may use cough suppressants such as Delsym and Robitussin.  If you have glaucoma or high blood pressure, you can also use Coricidin HBP.   For cough I have prescribed for you A prescription cough medication called Tessalon Perles 100 mg. You may take 1-2 capsules every 8 hours as needed for cough  If you have a sore or scratchy throat, use a saltwater gargle-  to  teaspoon of salt dissolved in a 4-ounce to 8-ounce glass of warm water.  Gargle the solution for approximately 15-30 seconds and then spit.  It is important not to swallow the solution.  You can also use throat lozenges/cough drops and Chloraseptic spray to help with throat pain or discomfort.  Warm or cold liquids can also be helpful in relieving throat pain.  For headache, pain or general discomfort, you can use Ibuprofen or Tylenol as directed.   Some authorities believe that zinc sprays or the use of Echinacea may shorten the course of your symptoms.   HOME CARE Only take medications as instructed by your medical team. Be sure to drink plenty of fluids. Water is fine as well as fruit juices, sodas and electrolyte beverages. You may want to stay away from caffeine or alcohol. If you are nauseated, try taking small sips of liquids. How do you know if you are getting enough  fluid? Your urine should be a pale yellow or almost colorless. Get rest. Taking a steamy shower or using a humidifier may help nasal congestion and ease sore throat pain. You can place a towel over your head and breathe in the steam from hot water coming from a faucet. Using a saline nasal spray works much the same way. Cough drops, hard candies and sore throat lozenges may ease your cough. Avoid close contacts especially the very young and the elderly Cover your mouth if you cough or sneeze Always remember to wash your hands.   GET HELP RIGHT AWAY IF: You develop worsening fever. If your symptoms do not improve within 10 days You develop yellow or green discharge from your nose over 3 days. You have coughing fits You develop a severe head ache or visual changes. You develop shortness of breath, difficulty breathing or start having chest pain Your symptoms persist after you have completed your treatment plan  MAKE SURE YOU  Understand these instructions. Will watch your condition. Will get help right away if you are not doing well or get worse.  Thank you for choosing an e-visit.  Your e-visit answers were reviewed by a board certified advanced clinical practitioner to complete your personal care plan. Depending upon the condition, your plan could have included both over the counter or prescription medications.  Please review your pharmacy choice. Make sure the pharmacy is open so you can pick up prescription now. If there is a problem, you may contact your provider through [employer] and have the prescription routed to another pharmacy.  Your safety is important to [place]. If you have drug allergies check your prescription carefully.   For the next 24 hours you can use MyChart to ask questions about today's visit, request a non-urgent call back, or ask for a work or school excuse. You will get an email in the next two days asking about your experience. I hope that your e-visit has  been valuable and will speed your recovery.

## 2022-02-17 NOTE — Progress Notes (Signed)
Message sent to patient requesting further input regarding current symptoms. Awaiting patient response.  

## 2022-02-17 NOTE — Progress Notes (Signed)
I have spent 5 minutes in review of e-visit questionnaire, review and updating patient chart, medical decision making and response to patient.   Fabion Gatson Cody Irving Lubbers, PA-C    

## 2022-02-18 ENCOUNTER — Telehealth: Payer: Commercial Managed Care - PPO | Admitting: Family Medicine

## 2022-02-18 ENCOUNTER — Telehealth (INDEPENDENT_AMBULATORY_CARE_PROVIDER_SITE_OTHER): Payer: Commercial Managed Care - PPO | Admitting: Family Medicine

## 2022-02-18 ENCOUNTER — Encounter: Payer: Self-pay | Admitting: Family Medicine

## 2022-02-18 DIAGNOSIS — J0100 Acute maxillary sinusitis, unspecified: Secondary | ICD-10-CM

## 2022-02-18 MED ORDER — AMOXICILLIN-POT CLAVULANATE 875-125 MG PO TABS: 1.0000 | ORAL_TABLET | Freq: Two times a day (BID) | ORAL | 0 refills | Status: AC

## 2022-02-18 MED ORDER — PREDNISONE 20 MG PO TABS: 40.0000 mg | ORAL_TABLET | Freq: Every day | ORAL | 0 refills | Status: AC

## 2022-02-18 MED ORDER — NIRMATRELVIR/RITONAVIR (PAXLOVID)TABLET: 3.0000 | ORAL_TABLET | Freq: Two times a day (BID) | ORAL | 0 refills | Status: AC

## 2022-02-18 NOTE — Progress Notes (Addendum)
Chief Complaint  Patient presents with   Nasal Congestion    Sore throat  Headache Some body aches     KHIEM HUNN here for URI complaints. Due to COVID-19 pandemic, we are interacting via web portal for an electronic face-to-face visit. I verified patient's ID using 2 identifiers. Patient agreed to proceed with visit via this method. Patient is at home, I am at office. Patient and I are present for visit.    Duration: 3 days  Associated symptoms: sinus congestion, sinus pain, rhinorrhea, achy, and sore throat Denies: itchy watery eyes, ear pain, ear drainage, wheezing, shortness of breath, coughing, and N/V/D Treatment to date: Nyquil, Dayquil, Alka-seltzer cold/flu Sick contacts: Yes; wife  Past Medical History:  Diagnosis Date   Chronic rhinitis    H/O: pneumonia 2013   Sleep apnea     Objective No conversational dyspnea Age appropriate judgment and insight Nml affect and mood  Acute maxillary sinusitis, recurrence not specified - Plan: predniSONE (DELTASONE) 20 MG tablet, amoxicillin-clavulanate (AUGMENTIN) 875-125 MG tablet  5 d pred burst 40 mg/d. If no improvement in 2 d, will trial 7 d of Augmentin given temp. Wife is getting tested for covid today. Will change plan if she tests +. Continue to push fluids, practice good hand hygiene, cover mouth when coughing. F/u prn. If starting to experience worsening s/s's, shaking, or shortness of breath, seek immediate care. Pt voiced understanding and agreement to the plan.  Addendum: Patient's wife tested + for covid. Will send in Paxlovid for 5 d.   Colbert, DO 02/18/22 8:22 AM

## 2022-02-18 NOTE — Addendum Note (Signed)
Addended by: Ames Coupe on: 02/18/2022 04:02 PM   Modules accepted: Orders

## 2022-04-30 DIAGNOSIS — G4733 Obstructive sleep apnea (adult) (pediatric): Secondary | ICD-10-CM | POA: Diagnosis not present

## 2022-05-04 ENCOUNTER — Ambulatory Visit: Payer: Commercial Managed Care - PPO | Admitting: Dermatology

## 2022-05-04 ENCOUNTER — Encounter: Payer: Self-pay | Admitting: Dermatology

## 2022-05-04 VITALS — BP 123/77

## 2022-05-04 DIAGNOSIS — L814 Other melanin hyperpigmentation: Secondary | ICD-10-CM

## 2022-05-04 DIAGNOSIS — L820 Inflamed seborrheic keratosis: Secondary | ICD-10-CM

## 2022-05-04 DIAGNOSIS — W908XXA Exposure to other nonionizing radiation, initial encounter: Secondary | ICD-10-CM | POA: Diagnosis not present

## 2022-05-04 DIAGNOSIS — D492 Neoplasm of unspecified behavior of bone, soft tissue, and skin: Secondary | ICD-10-CM | POA: Diagnosis not present

## 2022-05-04 DIAGNOSIS — D239 Other benign neoplasm of skin, unspecified: Secondary | ICD-10-CM

## 2022-05-04 DIAGNOSIS — L821 Other seborrheic keratosis: Secondary | ICD-10-CM

## 2022-05-04 DIAGNOSIS — L578 Other skin changes due to chronic exposure to nonionizing radiation: Secondary | ICD-10-CM

## 2022-05-04 DIAGNOSIS — Z1283 Encounter for screening for malignant neoplasm of skin: Secondary | ICD-10-CM | POA: Diagnosis not present

## 2022-05-04 DIAGNOSIS — D2372 Other benign neoplasm of skin of left lower limb, including hip: Secondary | ICD-10-CM | POA: Diagnosis not present

## 2022-05-04 DIAGNOSIS — L409 Psoriasis, unspecified: Secondary | ICD-10-CM

## 2022-05-04 DIAGNOSIS — D1801 Hemangioma of skin and subcutaneous tissue: Secondary | ICD-10-CM

## 2022-05-04 DIAGNOSIS — D229 Melanocytic nevi, unspecified: Secondary | ICD-10-CM

## 2022-05-04 DIAGNOSIS — X32XXXA Exposure to sunlight, initial encounter: Secondary | ICD-10-CM | POA: Diagnosis not present

## 2022-05-04 MED ORDER — CLOBETASOL PROPIONATE 0.05 % EX OINT: 1.0000 | TOPICAL_OINTMENT | Freq: Two times a day (BID) | CUTANEOUS | 0 refills | Status: AC

## 2022-05-04 NOTE — Progress Notes (Unsigned)
New Patient Visit   Subjective  Timothy Ho is a 57 y.o. male who presents for the following: Moles on the back x 20 years are becoming darker. No personal or family history of skin cancer. A lesion on the left medial lower leg x 5 years. He picks at it and irritates it. Rash on the lower back x 1 year. It improved with Triamcinolone cream, but it doesn't go away.  The following portions of the chart were reviewed this encounter and updated as appropriate: medications, allergies, medical history  Review of Systems:  No other skin or systemic complaints except as noted in HPI or Assessment and Plan.  Objective  Well appearing patient in no apparent distress; mood and affect are within normal limits.  A focused examination was performed of the following areas: Upper body exam  Relevant exam findings are noted in the Assessment and Plan.    Assessment & Plan   LENTIGINES, SEBORRHEIC KERATOSES, HEMANGIOMAS - Benign normal skin lesions - Benign-appearing - Call for any changes  MELANOCYTIC NEVI - Tan-brown and/or pink-flesh-colored symmetric macules and papules - Benign appearing on exam today - Observation - Call clinic for new or changing moles - Recommend daily use of broad spectrum spf 30+ sunscreen to sun-exposed areas.   ACTINIC DAMAGE - Chronic condition, secondary to cumulative UV/sun exposure - diffuse scaly erythematous macules with underlying dyspigmentation - Recommend daily broad spectrum sunscreen SPF 30+ to sun-exposed areas, reapply every 2 hours as needed.  - Staying in the shade or wearing long sleeves, sun glasses (UVA+UVB protection) and wide brim hats (4-inch brim around the entire circumference of the hat) are also recommended for sun protection.  - Call for new or changing lesions.  SKIN CANCER SCREENING PERFORMED TODAY   INFLAMED SEBORRHEIC KERATOSIS Exam: Erythematous keratotic or waxy stuck-on papule or plaque.  Symptomatic, irritating,  patient would like treated.  Benign-appearing.  Call clinic for new or changing lesions.   Prior to procedure, discussed risks of blister formation, small wound, skin dyspigmentation, or rare scar following treatment. Recommend Vaseline ointment to treated areas while healing.  Destruction Procedure Note Destruction method: cryotherapy   Informed consent: discussed and consent obtained   Lesion destroyed using liquid nitrogen: Yes   Outcome: patient tolerated procedure well with no complications   Post-procedure details: wound care instructions given   Locations: right upper back # of Lesions Treated: 2  SEBORRHEIC KERATOSIS - Stuck-on, waxy, tan-brown papules and/or plaques  - Benign-appearing - Discussed benign etiology and prognosis. - Observe - Call for any changes    DERMATOFIBROMA Exam: Firm pink/brown papulenodule with dimple sign. Treatment Plan: A dermatofibroma is a benign growth possibly related to trauma, such as an insect bite, cut from shaving, or inflamed acne-type bump.  Treatment options to remove include shave or excision with resulting scar and risk of recurrence.  Since benign-appearing and not bothersome, will observe for now.    PSORIASIS Exam: Well-demarcated erythematous papules/plaques with silvery scale, guttate pink scaly papules. 2% BSA. PGA 4%  flared  patient denies joint pain: yes  Psoriasis is a chronic non-curable, but treatable genetic/hereditary disease that may have other systemic features affecting other organ systems such as joints (Psoriatic Arthritis). It is associated with an increased risk of inflammatory bowel disease, heart disease, non-alcoholic fatty liver disease, and depression.  Treatments include light and laser treatments; topical medications; and systemic medications including oral and injectables.  Treatment Plan: Clobetasol cream BID to lower back, behind ears x  2 weeks then stop. Restart in 2 weeks as needed       No  follow-ups on file.  Jaclynn Guarneri, CMA, am acting as scribe for Langston Reusing, MD.   Documentation: I have reviewed the above documentation for accuracy and completeness, and I agree with the above.  Langston Reusing, MD

## 2022-05-04 NOTE — Patient Instructions (Signed)
Due to recent changes in healthcare laws, you may see results of your pathology and/or laboratory studies on MyChart before the doctors have had a chance to review them. We understand that in some cases there may be results that are confusing or concerning to you. Please understand that not all results are received at the same time and often the doctors may need to interpret multiple results in order to provide you with the best plan of care or course of treatment. Therefore, we ask that you please give [place] 2 business days to thoroughly review all your results before contacting the office for clarification. Should we see a critical lab result, you will be contacted sooner.   If You Need Anything After Your Visit  If you have any questions or concerns for your doctor, please call our main line at [phone] If no one answers, please leave a voicemail as directed and we will return your call as soon as possible. Messages left after 4 pm will be answered the following business day.   You may also send [place] a message via [place]. We typically respond to MyChart messages within 1-2 business days.  For prescription refills, please ask your pharmacy to contact our office. Our fax number is [phone].  If you have an urgent issue when the clinic is closed that cannot wait until the next business day, you can page your doctor at the number below.    Please note that while we do our best to be available for urgent issues outside of office hours, we are not available 24/7.   If you have an urgent issue and are unable to reach [place], you may choose to seek medical care at your doctor's office, retail clinic, urgent care center, or emergency room.  If you have a medical emergency, please immediately call 911 or go to the emergency department. In the event of inclement weather, please call our main line at [phone] for an update on the status of any delays or closures.  Dermatology Medication Tips: Please  keep the boxes that topical medications come in in order to help keep track of the instructions about where and how to use these. Pharmacies typically print the medication instructions only on the boxes and not directly on the medication tubes.   If your medication is too expensive, please contact our office at [phone] or send [place] a message through [place].   We are unable to tell what your co-pay for medications will be in advance as this is different depending on your insurance coverage. However, we may be able to find a substitute medication at lower cost or fill out paperwork to get insurance to cover a needed medication.   If a prior authorization is required to get your medication covered by your insurance company, please allow [place] 1-2 business days to complete this process.  Drug prices often vary depending on where the prescription is filled and some pharmacies may offer cheaper prices.  The website www.goodrx.com contains coupons for medications through different pharmacies. The prices here do not account for what the cost may be with help from insurance (it may be cheaper with your insurance), but the website can give you the price if you did not use any insurance.  - You can print the associated coupon and take it with your prescription to the pharmacy.  - You may also stop by our office during regular business hours and pick up a GoodRx coupon card.  - If you need your  prescription sent electronically to a different pharmacy, notify our office through [facility] or by phone at [phone]    Cryotherapy Aftercare  Wash gently with soap and water everyday.   Apply Vaseline and Band-Aid daily until healed. Skin Education :   I counseled the patient regarding the following: Sun screen (SPF 30 or greater) should be applied during peak UV exposure (between 10am and 2pm) and reapplied after exercise or swimming.  The ABCDEs of melanoma were reviewed with the patient, and  the importance of monthly self-examination of moles was emphasized. Should any moles change in shape or color, or itch, bleed or burn, pt will contact our office for evaluation sooner then their interval appointment.  Plan: Sunscreen Recommendations I recommended a broad spectrum sunscreen with a SPF of 30 or higher. I explained that SPF 30 sunscreens block approximately 97 percent of the sun's harmful rays. Sunscreens should be applied at least 15 minutes prior to expected sun exposure and then every 2 hours after that as long as sun exposure continues. If swimming or exercising sunscreen should be reapplied every 45 minutes to an hour after getting wet or sweating. One ounce, or the equivalent of a shot glass full of sunscreen, is adequate to protect the skin not covered by a bathing suit. I also recommended a lip balm with a sunscreen as well. Sun protective clothing can be used in lieu of sunscreen but must be worn the entire time you are exposed to the sun's rays. Patient Handout: Wound Care for Skin Biopsy Site  Patient Handout: Wound Care for Skin Biopsy Site  Taking Care of Your Skin Biopsy Site  Proper care of the biopsy site is essential for promoting healing and minimizing scarring. This handout provides instructions on how to care for your biopsy site to ensure optimal recovery.  1. Cleaning the Wound:  Clean the biopsy site daily with gentle soap and water. Gently pat the area dry with a clean, soft towel. Avoid harsh scrubbing or rubbing the area, as this can irritate the skin and delay healing.  2. Applying Aquaphor and Bandage:  After cleaning the wound, apply a thin layer of Aquaphor ointment to the biopsy site. Cover the area with a sterile bandage to protect it from dirt, bacteria, and friction. Change the bandage daily or as needed if it becomes soiled or wet.  3. Continued Care for One Week:  Repeat the cleaning, Aquaphor application, and bandaging process daily for one  week following the biopsy procedure. Keeping the wound clean and moist during this initial healing period will help prevent infection and promote optimal healing.  4. Massaging Aquaphor into the Area:  ---After one week, discontinue the use of bandages but continue to apply Aquaphor to the biopsy site. ----Gently massage the Aquaphor into the area using circular motions. ---Massaging the skin helps to promote circulation and prevent the formation of scar tissue.   Additional Tips:  Avoid exposing the biopsy site to direct sunlight during the healing process, as this can cause hyperpigmentation or worsen scarring. If you experience any signs of infection, such as increased redness, swelling, warmth, or drainage from the wound, contact your healthcare provider immediately. Follow any additional instructions provided by your healthcare provider for caring for the biopsy site and managing any discomfort. Conclusion:  Taking proper care of your skin biopsy site is crucial for ensuring optimal healing and minimizing scarring. By following these instructions for cleaning, applying Aquaphor, and massaging the area, you can promote  a smooth and successful recovery. If you have any questions or concerns about caring for your biopsy site, don't hesitate to contact your healthcare provider for guidance.

## 2022-05-30 DIAGNOSIS — G4733 Obstructive sleep apnea (adult) (pediatric): Secondary | ICD-10-CM | POA: Diagnosis not present

## 2022-06-30 DIAGNOSIS — G4733 Obstructive sleep apnea (adult) (pediatric): Secondary | ICD-10-CM | POA: Diagnosis not present

## 2022-08-03 ENCOUNTER — Ambulatory Visit: Payer: Commercial Managed Care - PPO | Admitting: Dermatology

## 2022-09-15 ENCOUNTER — Encounter: Payer: Self-pay | Admitting: Family Medicine

## 2022-09-17 ENCOUNTER — Other Ambulatory Visit: Payer: Self-pay | Admitting: Family Medicine

## 2022-09-17 DIAGNOSIS — G4731 Primary central sleep apnea: Secondary | ICD-10-CM

## 2022-09-17 DIAGNOSIS — G4730 Sleep apnea, unspecified: Secondary | ICD-10-CM

## 2022-10-01 ENCOUNTER — Telehealth: Payer: Self-pay | Admitting: Family Medicine

## 2022-10-01 NOTE — Telephone Encounter (Signed)
09/22/22 - LVM ag  09/17/22 cone aenta no auth req EE

## 2022-10-07 ENCOUNTER — Ambulatory Visit: Payer: Commercial Managed Care - PPO | Admitting: Neurology

## 2022-10-07 DIAGNOSIS — G4730 Sleep apnea, unspecified: Secondary | ICD-10-CM

## 2022-10-07 DIAGNOSIS — G4733 Obstructive sleep apnea (adult) (pediatric): Secondary | ICD-10-CM | POA: Diagnosis not present

## 2022-10-07 DIAGNOSIS — G4739 Other sleep apnea: Secondary | ICD-10-CM

## 2022-10-21 NOTE — Progress Notes (Signed)
[facility] Timothy Ho  MRN: [id number]    HOME SLEEP TEST REPORT ( by Watch PAT)   STUDY DATE:  10-21-2022- mail out test    ORDERING CLINICIAN: Shawnie Dapper, NP  REFERRING CLINICIAN: Dr Carmelia Roller,   CLINICAL INFORMATION/HISTORY: complex sleep apnea patient , had recently sinusitis, last seen in a video visit with Amy Lomax on 02 -13-2024. Uses CPAP 4-8 cm water  airsense 10, 3 cm EPR and residual AHI was 0.4/h 95% pressure was 7,5 cm water.  Mask is not named.      Epworth sleepiness score: not stated /24.   BMI: not stated kg/m   Neck Circumference: not stated   FINDINGS:   Sleep Summary:   Total Recording Time (hours, min): 9 hours 59 minutes      Total Sleep Time (hours, min): 8 hours 12 minutes               Percent REM (%):   21.6%                                     Respiratory Indices:   Calculated pAHI (per hour): 24.5/h                            REM pAHI:      3.4/h                                           NREM pAHI:    30.4/h                          Positional AHI:   The patient slept mostly on his left side with an AHI of 22/h versus supine sleep with an AHI of 28/h.  Snoring level reached a mean volume of 41 dB and was present for about a quarter of the total recorded sleep time.                                                Oxygen Saturation Statistics:   O2 Saturation Range (%): Between a nadir at 85 and a maximum saturation of 99% with a mean saturation of 95%                                       O2 Saturation (minutes) <89%: 0.1 minutes         Pulse Rate Statistics:   Pulse Mean (bpm):    69 bpm             Pulse Range: Between 51 and 110 bpm               IMPRESSION:  This HST confirms the presence of moderate severe sleep apnea which is the home sleep test device identified as obstructive.  This patient's apneas are clustered all in non-REM sleep not in rem sleep which would be an unusual distribution for obstructive  sleep apnea and I suspect that there is a high  central component.  However the overall AHI has been well-controlled on CPAP and for this reason we will order a new auto titration device with specific settings.   RECOMMENDATION: S11 AutoSet by [employer] with a setting from 3 to 10 cm water pressure with 3 cm EPR, interface of patient's choice. This patient can follow-up with nurse practitioner Amy Lomax within the first 3 months of new CPAP use.     INTERPRETING PHYSICIAN:   Melvyn Novas, MD

## 2022-10-27 ENCOUNTER — Other Ambulatory Visit: Payer: Self-pay | Admitting: Neurology

## 2022-10-27 DIAGNOSIS — G4730 Sleep apnea, unspecified: Secondary | ICD-10-CM | POA: Insufficient documentation

## 2022-10-27 DIAGNOSIS — H524 Presbyopia: Secondary | ICD-10-CM | POA: Diagnosis not present

## 2022-10-27 NOTE — Procedures (Signed)
[facility] Timothy Ho  MRN: [id number]    HOME SLEEP TEST REPORT ( by Watch PAT)   STUDY DATE:  10-21-2022- mail out test    ORDERING CLINICIAN: Shawnie Dapper, NP  REFERRING CLINICIAN: Dr Carmelia Roller,   CLINICAL INFORMATION/HISTORY: complex sleep apnea patient , had recently sinusitis, last seen in a video visit with Amy Lomax on 02 -13-2024. Uses CPAP 4-8 cm water  airsense 10, 3 cm EPR and residual AHI was 0.4/h 95% pressure was 7,5 cm water.  Mask is not named.      Epworth sleepiness score: not stated /24.   BMI: not stated kg/m   Neck Circumference: not stated   FINDINGS:   Sleep Summary:   Total Recording Time (hours, min): 9 hours 59 minutes      Total Sleep Time (hours, min): 8 hours 12 minutes               Percent REM (%):   21.6%                                     Respiratory Indices:   Calculated pAHI (per hour): 24.5/h                            REM pAHI:      3.4/h                                           NREM pAHI:    30.4/h                          Positional AHI:   The patient slept mostly on his left side with an AHI of 22/h versus supine sleep with an AHI of 28/h.  Snoring level reached a mean volume of 41 dB and was present for about a quarter of the total recorded sleep time.                                                Oxygen Saturation Statistics:   O2 Saturation Range (%): Between a nadir at 85 and a maximum saturation of 99% with a mean saturation of 95%                                       O2 Saturation (minutes) <89%: 0.1 minutes         Pulse Rate Statistics:   Pulse Mean (bpm):    69 bpm             Pulse Range: Between 51 and 110 bpm               IMPRESSION:  This HST confirms the presence of moderate severe sleep apnea which is the home sleep test device identified as obstructive.  This patient's apneas are clustered all in non-REM sleep not in rem sleep which would be an unusual distribution for obstructive  sleep apnea and I suspect that there is a high  central component.  However the overall AHI has been well-controlled on CPAP and for this reason we will order a new auto titration device with specific settings.   RECOMMENDATION: S11 AutoSet by [employer] with a setting from 3 to 10 cm water pressure with 3 cm EPR, interface of patient's choice. This patient can follow-up with nurse practitioner Amy Lomax within the first 3 months of new CPAP use.     INTERPRETING PHYSICIAN:   Melvyn Novas, MD

## 2022-10-28 ENCOUNTER — Other Ambulatory Visit: Payer: Self-pay | Admitting: Family Medicine

## 2022-10-28 ENCOUNTER — Telehealth: Payer: Self-pay | Admitting: *Deleted

## 2022-10-28 DIAGNOSIS — G4730 Sleep apnea, unspecified: Secondary | ICD-10-CM

## 2022-10-28 NOTE — Telephone Encounter (Signed)
   Sent community message to [employer] that order placed.

## 2022-10-28 NOTE — Telephone Encounter (Signed)
Phone room: please call pt to cx 02/23/23 appt and move up to be between 12/12/22-02/09/23 for initial cpap f/u

## 2022-11-02 NOTE — Telephone Encounter (Signed)
Noted  

## 2022-11-02 NOTE — Telephone Encounter (Signed)
Called pt to r/s appt. Pt stated that he has not received the replacement cpap machine. Pt will be calling DME and will call us back to r/s once he receives machine.

## 2022-11-24 ENCOUNTER — Encounter: Payer: Self-pay | Admitting: Family Medicine

## 2022-11-26 ENCOUNTER — Other Ambulatory Visit: Payer: Self-pay | Admitting: *Deleted

## 2022-11-26 DIAGNOSIS — G4739 Other sleep apnea: Secondary | ICD-10-CM

## 2022-11-26 DIAGNOSIS — G4730 Sleep apnea, unspecified: Secondary | ICD-10-CM

## 2022-12-01 DIAGNOSIS — G4733 Obstructive sleep apnea (adult) (pediatric): Secondary | ICD-10-CM | POA: Diagnosis not present

## 2022-12-31 DIAGNOSIS — G4733 Obstructive sleep apnea (adult) (pediatric): Secondary | ICD-10-CM | POA: Diagnosis not present

## 2023-01-31 DIAGNOSIS — G4733 Obstructive sleep apnea (adult) (pediatric): Secondary | ICD-10-CM | POA: Diagnosis not present

## 2023-02-17 NOTE — Progress Notes (Signed)
 PATIENT: Timothy Ho DOB: 1965/08/12  REASON FOR VISIT: follow up HISTORY FROM: patient  Virtual Visit via Telephone Note  I connected with Timothy Ho on 02/23/23 at  8:15 AM EST by telephone and verified that I am speaking with the correct person using two identifiers.   I discussed the limitations, risks, security and privacy concerns of performing an evaluation and management service by telephone and the availability of in person appointments. I also discussed with the patient that there may be a patient responsible charge related to this service. The patient expressed understanding and agreed to proceed.   History of Present Illness:  02/23/23 ALL (Mychart): Timothy Ho returns for follow up for complex sleep apnea on CPAP. He continues to do well. He is using CPAP nightly for about 7 hours, on average. He denies concerns with machine or supplies.     02/17/2022 ALL: (Mychart):  Timothy Ho returns for follow up for complex sleep apnea on CPAP. He is doing well. He denies concerns with machine. He was set up in 11/2017. He does have some skin irritation where mask strap hits side of face. He is using a barrier cloth.     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 He continues to do very well. He is using CPAP nightly. No concerns with supplies. He does report side door of machine is broken and wishes to contact DME for replacement part. Otherwise he is doing very well and without concerns.     01/30/2020 ALL (Mychart): Timothy Ho returns today for follow-up of complex sleep apnea on CPAP therapy.  He continues to do well.  He is using CPAP nightly. He does note improvement in sleep quality and energy levels. He denies concerns today.   Compliance report dated 01/30/2019 through 01/28/2020 reveals that he used CPAP 30 of the past 30 days for compliance of 100%.  He used CPAP greater than 4 hours all 30 days for compliance of 100%.  Average  usage was 7 hours and 30 minutes.  Residual AHI was 0.7 on 4 to 8 cm of water and an EPR of 3.  There was no significant leak noted.  01/30/2019 ALL: Timothy Ho is a 58 y.o. 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  He continues to do well on CPAP therapy.  He reports consistent use.  Compliance report dated 12/27/2018 through 01/25/2019 reveals that he has used CPAP nightly for compliance of 100%.  He has used CPAP greater than 4 hours every night.  Average usage was 7 hours and 41 minutes.  Residual AHI was 0.8 on 4 to 8 cm of water and an EPR of 3.  There was no significant leak noted.  History (copied from [employer] note on 01/06/2018)   Timothy Ho presents today for Cpap follow up. He is compliant with CPAP usage as download shows usage of 30/30 days for 100% compliance. He used his machine for > 4 hours 30/30 days for 100% compliance. On average he uses his CPAP 8 hours and 2 minutes per day. His residual AHI is 1.3  on 4-8 cmH2O with EPR of 3. There is no leak. He reports feeling less fatigued in the mornings. He is here today for an evaluation.    HISTORY (Copied from Dr.Dohmeier's note)  Timothy Ho is a 58 y.o. male patient seen on 09-22-2017 seen here as in a referral from Dr. Carmelia Roller for a  sleep evaluation.   Chief complaint according to patient : Son noticed I have the pleasure meeting Timothy. Timothy Ho today, who brought his spouse -another patient of mine- today to the meeting.  His main concern has been that family members noticed him snoring,  but just last week he underwent a colonoscopy and was made aware by the nurse that he snored and paused in his breathing.   Sleep habits are as follows:  He introduced exercises on week ends and feels almost a high after it. He feels wound up but is able to sleep about 2 hours later. Watches Tv , goes to bed by 11.30- and asleep by midnight. Some nights he uses Zyquil. The bedroom is cool, quiet and  dark.  He will sleep for 6 hours, rarely has nocturia. He sleeps promptly and his wife noted crescendo breathing. He sleeps on his left side. He uses 2 pillows for head support, flat bed. He may have one bathroom break at night but usually has no difficulties to go back to sleep.  Once he wakes up in the morning at about 6 AM he is not refreshed and restored and feels that he would crave more sleep.   Sleep medical history and family sleep history: Brother Timothy Ho has apnea. Brother Timothy Ho has insomnia.    Social history: married, Psychiatric nurse at [employer], 2 children - 66 and 60 years old.  Non smoker, ETOH- socially 1-2/ week. Caffeine: 2-4 cups in AM of coffee, none after lunch. No iced tea , but soda at dinner.    Observations/Objective:  Generalized: Well developed, in no acute distress  Mentation: Alert oriented to time, place, history taking. Follows all commands speech and language fluent   Assessment and Plan:  58 y.o. year old male  has a past medical history of Chronic rhinitis, H/O: pneumonia (2013), and Sleep apnea. here with    ICD-10-CM   1. 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  Compliance report reveals excellent compliance.  He was encouraged to continue using CPAP nightly and greater than 4 hours each night. Supply orders sent to DME today. Healthy lifestyle habits encouraged. He will follow up in 1 year, sooner you have he verbalizes understanding and agreement with this plan.   Orders Placed This Encounter  Procedures   For home use only DME continuous positive airway pressure (CPAP)    Heated Humidity with all supplies as needed    Length of Need:   Lifetime    Patient has OSA or probable OSA:   Yes    Is the patient currently using CPAP in the home:   Yes    Settings:   Other  see comments    CPAP supplies needed:   Mask, headgear, cushions, filters, heated tubing and water chamber    No orders of the defined types were placed in this encounter.     Follow Up Instructions:  I discussed the assessment and treatment plan with the patient. The patient was provided an opportunity to ask questions and all were answered. The patient agreed with the plan and demonstrated an understanding of the instructions.   The patient was advised to call back or seek an in-person evaluation if the symptoms worsen or if the condition fails  to improve as anticipated.  I provided 15 minutes of non-face-to-face time during this encounter.  Patient is located at his place of residence during my chart visit.  Providers in the office.   Shawnie Dapper, NP

## 2023-02-22 ENCOUNTER — Encounter: Payer: Self-pay | Admitting: *Deleted

## 2023-02-22 NOTE — Progress Notes (Unsigned)
 Marland Kitchen

## 2023-02-23 ENCOUNTER — Telehealth: Payer: Commercial Managed Care - PPO | Admitting: Family Medicine

## 2023-02-23 ENCOUNTER — Encounter: Payer: Self-pay | Admitting: Family Medicine

## 2023-02-23 DIAGNOSIS — G4730 Sleep apnea, unspecified: Secondary | ICD-10-CM | POA: Diagnosis not present

## 2023-02-23 DIAGNOSIS — G4739 Other sleep apnea: Secondary | ICD-10-CM

## 2023-02-23 NOTE — Patient Instructions (Signed)

## 2023-03-03 DIAGNOSIS — G4733 Obstructive sleep apnea (adult) (pediatric): Secondary | ICD-10-CM | POA: Diagnosis not present

## 2023-03-31 DIAGNOSIS — G4733 Obstructive sleep apnea (adult) (pediatric): Secondary | ICD-10-CM | POA: Diagnosis not present

## 2023-05-01 DIAGNOSIS — G4733 Obstructive sleep apnea (adult) (pediatric): Secondary | ICD-10-CM | POA: Diagnosis not present

## 2023-05-04 ENCOUNTER — Encounter: Payer: Commercial Managed Care - PPO | Admitting: Dermatology

## 2023-05-17 ENCOUNTER — Other Ambulatory Visit: Payer: Self-pay

## 2023-05-17 ENCOUNTER — Telehealth: Payer: Self-pay | Admitting: Family Medicine

## 2023-05-17 DIAGNOSIS — L089 Local infection of the skin and subcutaneous tissue, unspecified: Secondary | ICD-10-CM

## 2023-05-17 NOTE — Telephone Encounter (Signed)
 Copied from CRM [id number]. Topic: Referral - Request for Referral >> May 17, 2023  9:27 AM Albertha Alosa wrote: Did the patient discuss referral with their provider in the last year? Yes (If No - schedule appointment) (If Yes - send message)  Appointment offered? Yes  Type of order/referral and detailed reason for visit: Dermatology   Preference of office, provider, location:   [facility]  [facility] [address] [place], [place] [address] Located on the [address] side of the street.   Phone: [phone] Fax: [phone]  If referral order, have you been seen by this specialty before? Yes (If Yes, this issue or another issue? When? Where?  Can we respond through MyChart? Yes

## 2023-05-17 NOTE — Telephone Encounter (Signed)
 Referral placed and sent Pt Mychart message.

## 2023-05-18 DIAGNOSIS — G4733 Obstructive sleep apnea (adult) (pediatric): Secondary | ICD-10-CM | POA: Diagnosis not present

## 2023-05-26 ENCOUNTER — Encounter: Payer: Self-pay | Admitting: Dermatology

## 2023-05-26 ENCOUNTER — Other Ambulatory Visit (HOSPITAL_BASED_OUTPATIENT_CLINIC_OR_DEPARTMENT_OTHER): Payer: Self-pay

## 2023-05-26 ENCOUNTER — Ambulatory Visit: Admitting: Dermatology

## 2023-05-26 DIAGNOSIS — Z79899 Other long term (current) drug therapy: Secondary | ICD-10-CM

## 2023-05-26 DIAGNOSIS — Z7189 Other specified counseling: Secondary | ICD-10-CM | POA: Diagnosis not present

## 2023-05-26 DIAGNOSIS — D2372 Other benign neoplasm of skin of left lower limb, including hip: Secondary | ICD-10-CM

## 2023-05-26 DIAGNOSIS — L409 Psoriasis, unspecified: Secondary | ICD-10-CM

## 2023-05-26 DIAGNOSIS — D239 Other benign neoplasm of skin, unspecified: Secondary | ICD-10-CM

## 2023-05-26 MED ORDER — CLOBETASOL PROPIONATE 0.05 % EX CREA
TOPICAL_CREAM | CUTANEOUS | 3 refills | Status: AC
  Filled 2023-05-26: qty 60, 30d supply, fill #0
  Filled 2023-06-21: qty 60, 30d supply, fill #1
  Filled 2023-07-25: qty 60, 30d supply, fill #2

## 2023-05-26 MED ORDER — ZORYVE 0.3 % EX CREA
1.0000 | TOPICAL_CREAM | Freq: Every day | CUTANEOUS | 5 refills | Status: DC
Start: 2023-05-26 — End: 2023-06-01
  Filled 2023-05-26: qty 60, 30d supply, fill #0

## 2023-05-26 NOTE — Patient Instructions (Signed)

## 2023-05-26 NOTE — Progress Notes (Addendum)
 Follow-Up Visit   Subjective  Timothy Ho is a 58 y.o. male who presents for the following: rash on the back for a few years has been using Clobetasol  0.05% which helps, but rash never completely resolves. No fhx of psoriasis. 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 Pt would like to discuss treatment options. Rash on the L face, ear, and post auricular areas, pt concerned it may be from CPAP machine. Thickening of the skin on the L knuckle. Pt would like to discuss tx options.  The patient has spots, moles and lesions to be evaluated, some may be new or changing.   Patient accompanied by wife who contributes to history.   The following portions of the chart were reviewed this encounter and updated as appropriate: medications, allergies, medical history  Review of Systems:  No other skin or systemic complaints except as noted in HPI or Assessment and Plan.  Objective  Well appearing patient in no apparent distress; mood and affect are within normal limits.  A focused examination was performed of the following areas:  Relevant exam findings are noted in the Assessment and Plan.   Assessment & Plan   PSORIASIS - pt up to date on coloscopy per pt and wife.        Psoriasis Scalp elbows; low back Exam: Well-demarcated erythematous papules/plaques with silvery scale, guttate pink scaly papules of the mid back, elbows, neck, ears, post auricular areas, scalp. 10% BSA. Chronic and persistent condition with duration or expected duration over one year. Condition is symptomatic/ bothersome to patient. Not currently at goal.  Patient denies joint pain Pt is up to date on Colonscopies.  Psoriasis is a chronic non-curable, but treatable genetic/hereditary disease that may have other systemic features affecting other organ systems such as joints (Psoriatic Arthritis). It is associated with an increased risk of inflammatory bowel disease, heart disease, non-alcoholic fatty liver  disease, and depression.  Treatments include light and laser treatments; topical medications; and systemic medications including oral and injectables.  Treatment Plan: Recommend Otezla. Discussed possible side effects. Side effects of Otezla (apremilast) include diarrhea, nausea, headache, upper respiratory infection, depression, and weight decrease (5-10%). It should only be taken by pregnant women after a discussion regarding risks and benefits with their doctor. Goal is control of skin condition, not cure.  The use of Otezla requires long term medication management, including periodic office visits. Patient and wife defer oral treatment at this time.  Continue Clobetasol  0.05% cream to aa's BID 3-4 days per week PRN. Topical steroids (such as triamcinolone, fluocinolone, fluocinonide, mometasone, clobetasol , halobetasol, betamethasone , hydrocortisone) can cause thinning and lightening of the skin if they are used for too long in the same area. Your physician has selected the right strength medicine for your problem and area affected on the body. Please use your medication only as directed by your physician to prevent side effects.   Start Zorvye cream to aa's every day 7 days per week.   DERMATOFIBROMA   Related Procedures Ambulatory referral to Dermatology PSORIASIS   COUNSELING AND COORDINATION OF CARE   MEDICATION MANAGEMENT    DERMATOFIBROMA - L med calf, bx proven (Dr. Myrtie Atkinson, 05/04/22) Recurrent after shave removal - Symptomatic - pt would like removed. Recommend excision Exam: Firm pink/brown papulenodule with dimple sign. Treatment Plan: A dermatofibroma is a benign growth possibly related to trauma, such as an insect bite, cut from shaving, or inflamed acne-type bump.  Treatment options to remove include shave or excision with resulting scar  and risk of recurrence.  Since benign-appearing and not bothersome, will observe for now.   Bothersome to patient, recommend excision.  Will refer to Dr. Paci.   Return in about 6 months (around 11/26/2023) for psoriasis follow up.  Arlinda Lais, CMA, am acting as scribe for Celine Collard, MD .  Documentation: I have reviewed the above documentation for accuracy and completeness, and I agree with the above.  Celine Collard, MD

## 2023-05-26 NOTE — Addendum Note (Signed)
 Addended by: Elta Halter on: 05/26/2023 03:01 PM   Modules accepted: Level of Service

## 2023-05-27 ENCOUNTER — Other Ambulatory Visit (HOSPITAL_BASED_OUTPATIENT_CLINIC_OR_DEPARTMENT_OTHER): Payer: Self-pay

## 2023-05-28 ENCOUNTER — Other Ambulatory Visit (HOSPITAL_BASED_OUTPATIENT_CLINIC_OR_DEPARTMENT_OTHER): Payer: Self-pay

## 2023-05-31 DIAGNOSIS — G4733 Obstructive sleep apnea (adult) (pediatric): Secondary | ICD-10-CM | POA: Diagnosis not present

## 2023-06-01 ENCOUNTER — Other Ambulatory Visit (HOSPITAL_BASED_OUTPATIENT_CLINIC_OR_DEPARTMENT_OTHER): Payer: Self-pay

## 2023-06-01 ENCOUNTER — Telehealth: Payer: Self-pay

## 2023-06-01 MED ORDER — ZORYVE 0.3 % EX CREA: 1.0000 | TOPICAL_CREAM | Freq: Every day | CUTANEOUS | 5 refills | Status: DC

## 2023-06-01 NOTE — Telephone Encounter (Signed)
 Zoryve  cream not covered by insurance. Please advise.

## 2023-06-01 NOTE — Addendum Note (Signed)
 Addended by: Mara Semin[place] A on: 06/01/2023 12:45 PM   Modules accepted: Orders

## 2023-06-01 NOTE — Telephone Encounter (Signed)
 Zoryve cream sent to [facility]. Unable to leave a voicemail for patient since his voicemail box is full.

## 2023-06-03 NOTE — Telephone Encounter (Signed)
Voicemail box full, unable to leave message

## 2023-06-08 NOTE — Telephone Encounter (Signed)
 Discussed with patient. He will let us  know if Zoryve  still expensive through [place].

## 2023-06-10 ENCOUNTER — Encounter: Payer: Self-pay | Admitting: Dermatology

## 2023-06-16 ENCOUNTER — Encounter: Payer: Self-pay | Admitting: Dermatology

## 2023-06-16 ENCOUNTER — Other Ambulatory Visit (HOSPITAL_BASED_OUTPATIENT_CLINIC_OR_DEPARTMENT_OTHER): Payer: Self-pay

## 2023-06-16 ENCOUNTER — Ambulatory Visit: Admitting: Dermatology

## 2023-06-16 VITALS — BP 146/86 | HR 77 | Temp 98.5°F

## 2023-06-16 DIAGNOSIS — D239 Other benign neoplasm of skin, unspecified: Secondary | ICD-10-CM

## 2023-06-16 DIAGNOSIS — D2372 Other benign neoplasm of skin of left lower limb, including hip: Secondary | ICD-10-CM

## 2023-06-16 MED ORDER — MUPIROCIN 2 % EX OINT
1.0000 | TOPICAL_OINTMENT | Freq: Two times a day (BID) | CUTANEOUS | 2 refills | Status: AC
  Filled 2023-06-16: qty 22, 6d supply, fill #0
  Filled 2023-06-21: qty 22, 11d supply, fill #1
  Filled 2023-06-22: qty 22, 6d supply, fill #1
  Filled 2023-07-25: qty 22, 6d supply, fill #2

## 2023-06-16 NOTE — Progress Notes (Signed)
 Follow-Up Visit   Subjective  Timothy Ho is a 58 y.o. male who presents for the following: Excision of a dermatofibroma on the left lower leg anterior, referred by Dr. Bary Likes.  Patient states that the lesion is elevated and irritating.   The following portions of the chart were reviewed this encounter and updated as appropriate: medications, allergies, medical history  Review of Systems:  No other skin or systemic complaints except as noted in HPI or Assessment and Plan.  Objective  Well appearing patient in no apparent distress; mood and affect are within normal limits.  A focused examination was performed of the following areas: Left lower leg Relevant physical exam findings are noted in the Assessment and Plan.   Left Lower Leg - Anterior Hyperkeratotic papule   Assessment & Plan   DERMATOFIBROMA Left Lower Leg - Anterior Skin excision - Left Lower Leg - Anterior  Excision method:  elliptical Lesion length (cm):  1.5 Lesion width (cm):  1.1 Margin per side (cm):  0.1 Total excision diameter (cm):  1.7 Informed consent: discussed and consent obtained   Timeout: patient name, date of birth, surgical site, and procedure verified   Procedure prep:  Patient was prepped and draped in usual sterile fashion Prep type:  Chlorhexidine Anesthesia: the lesion was anesthetized in a standard fashion   Anesthetic:  1% lidocaine  w/ epinephrine 1-100,000 buffered w/ 8.4% NaHCO3 Instrument used: #15 blade   Hemostasis achieved with: electrodesiccation   Outcome: patient tolerated procedure well with no complications   Post-procedure details: sterile dressing applied and wound care instructions given   Dressing type: pressure dressing and petrolatum    Skin repair - Left Lower Leg - Anterior Complexity:  Complex Final length (cm):  5.1 Informed consent: discussed and consent obtained   Timeout: patient name, date of birth, surgical site, and procedure verified   Procedure  prep:  Patient was prepped and draped in usual sterile fashion Prep type:  Chlorhexidine Anesthesia: the lesion was anesthetized in a standard fashion   Anesthetic:  1% lidocaine  w/ epinephrine 1-100,000 buffered w/ 8.4% NaHCO3 Reason for type of repair: reduce tension to allow closure and preserve normal anatomical and functional relationships   Undermining: area extensively undermined   Subcutaneous layers (deep stitches):  Suture size:  4-0 Suture type: Vicryl (polyglactin 910)   Stitches:  Buried horizontal mattress Fine/surface layer approximation (top stitches):  Suture size:  5-0 Suture type: fast-absorbing plain gut   Stitches: simple running   Hemostasis achieved with: electrodesiccation Outcome: patient tolerated procedure well with no complications   Post-procedure details: sterile dressing applied and wound care instructions given   Dressing type: petrolatum and pressure dressing   Specimen 1 - Surgical pathology Differential Diagnosis: Biopsy proven dermatofibroma [id number]  Check Margins: Yes  The surgical wound was then cleaned, prepped, and re-anesthetized as above. Wound edges were undermined extensively along at least one entire edge and at a distance equal to or greater than the width of the defect (see wound defect size above) in order to achieve closure and decrease wound tension and anatomic distortion. Redundant tissue repair including standing cone removal was performed. Hemostasis was achieved with electrocautery. Subcutaneous and epidermal tissues were approximated with the above sutures. The surgical site was then lightly scrubbed with sterile, saline-soaked gauze. The area was then bandaged using Vaseline ointment, non-adherent gauze, gauze pads, and tape to provide an adequate pressure dressing. The patient tolerated the procedure well, was given detailed written and verbal wound care  instructions, and was discharged in good condition.   The patient will  follow-up: PRN.  Return if symptoms worsen or fail to improve.  Maurine Sovereign, RN, am acting as scribe for Deneise Finlay, MD .   Documentation: I have reviewed the above documentation for accuracy and completeness, and I agree with the above.  Deneise Finlay, MD

## 2023-06-16 NOTE — Patient Instructions (Signed)

## 2023-06-17 LAB — SURGICAL PATHOLOGY

## 2023-06-20 ENCOUNTER — Ambulatory Visit: Payer: Self-pay | Admitting: Dermatology

## 2023-06-21 ENCOUNTER — Other Ambulatory Visit (HOSPITAL_BASED_OUTPATIENT_CLINIC_OR_DEPARTMENT_OTHER): Payer: Self-pay

## 2023-06-21 ENCOUNTER — Other Ambulatory Visit: Payer: Self-pay

## 2023-06-22 ENCOUNTER — Other Ambulatory Visit (HOSPITAL_BASED_OUTPATIENT_CLINIC_OR_DEPARTMENT_OTHER): Payer: Self-pay

## 2023-07-01 DIAGNOSIS — G4733 Obstructive sleep apnea (adult) (pediatric): Secondary | ICD-10-CM | POA: Diagnosis not present

## 2023-07-30 ENCOUNTER — Other Ambulatory Visit (HOSPITAL_BASED_OUTPATIENT_CLINIC_OR_DEPARTMENT_OTHER): Payer: Self-pay

## 2023-07-31 DIAGNOSIS — G4733 Obstructive sleep apnea (adult) (pediatric): Secondary | ICD-10-CM | POA: Diagnosis not present

## 2023-08-06 ENCOUNTER — Ambulatory Visit: Payer: Self-pay | Admitting: Family Medicine

## 2023-08-06 ENCOUNTER — Ambulatory Visit (INDEPENDENT_AMBULATORY_CARE_PROVIDER_SITE_OTHER): Admitting: Family Medicine

## 2023-08-06 ENCOUNTER — Encounter: Payer: Self-pay | Admitting: Family Medicine

## 2023-08-06 VITALS — BP 132/84 | HR 83 | Temp 98.0°F | Resp 16 | Ht 77.0 in | Wt >= 6400 oz

## 2023-08-06 DIAGNOSIS — R7303 Prediabetes: Secondary | ICD-10-CM | POA: Diagnosis not present

## 2023-08-06 DIAGNOSIS — Z0000 Encounter for general adult medical examination without abnormal findings: Secondary | ICD-10-CM | POA: Diagnosis not present

## 2023-08-06 DIAGNOSIS — Z125 Encounter for screening for malignant neoplasm of prostate: Secondary | ICD-10-CM | POA: Diagnosis not present

## 2023-08-06 LAB — COMPREHENSIVE METABOLIC PANEL WITH GFR
ALT: 41 U/L (ref 0–53)
AST: 27 U/L (ref 0–37)
Albumin: 4.4 g/dL (ref 3.5–5.2)
Alkaline Phosphatase: 82 U/L (ref 39–117)
BUN: 11 mg/dL (ref 6–23)
CO2: 26 meq/L (ref 19–32)
Calcium: 9.3 mg/dL (ref 8.4–10.5)
Chloride: 106 meq/L (ref 96–112)
Creatinine, Ser: 0.82 mg/dL (ref 0.40–1.50)
GFR: 95.52 mL/min (ref >60.00)
Glucose, Bld: 105 mg/dL — ABNORMAL HIGH (ref 70–99)
Potassium: 4 meq/L (ref 3.5–5.1)
Sodium: 141 meq/L (ref 135–145)
Total Bilirubin: 0.9 mg/dL (ref 0.2–1.2)
Total Protein: 7.1 g/dL (ref 6.0–8.3)

## 2023-08-06 LAB — LIPID PANEL
Cholesterol: 182 mg/dL (ref 0–200)
HDL: 40.4 mg/dL (ref >39.00)
LDL Cholesterol: 122 mg/dL — ABNORMAL HIGH (ref 0–99)
NonHDL: 141.56
Total CHOL/HDL Ratio: 5
Triglycerides: 98 mg/dL (ref 0.0–149.0)
VLDL: 19.6 mg/dL (ref 0.0–40.0)

## 2023-08-06 LAB — HEMOGLOBIN A1C: Hgb A1c MFr Bld: 6 % (ref 4.6–6.5)

## 2023-08-06 LAB — CBC
HCT: 45.3 % (ref 39.0–52.0)
Hemoglobin: 15.2 g/dL (ref 13.0–17.0)
MCHC: 33.4 g/dL (ref 30.0–36.0)
MCV: 88 fl (ref 78.0–100.0)
Platelets: 214 K/uL (ref 150.0–400.0)
RBC: 5.15 Mil/uL (ref 4.22–5.81)
RDW: 14 % (ref 11.5–15.5)
WBC: 6.1 K/uL (ref 4.0–10.5)

## 2023-08-06 LAB — PSA: PSA: 1.33 ng/mL (ref 0.10–4.00)

## 2023-08-06 NOTE — Progress Notes (Signed)
 Chief Complaint  Patient presents with   Annual Exam    CPE    Well Male Timothy Ho is here for a complete physical.   His last physical was >1 year ago.  Current diet: in general, diet could be better.  Current exercise: walking Weight trend: increasing Fatigue out of ordinary? No. Seat belt? Yes.   Advanced directive? Yes  Health maintenance Shingrix- No Colonoscopy- Yes Tetanus- Yes HIV- Yes Hep C- Yes   Past Medical History:  Diagnosis Date   Chronic rhinitis    H/O: pneumonia 2013   Sleep apnea       Past Surgical History:  Procedure Laterality Date   COLONOSCOPY WITH PROPOFOL  N/A 09/10/2017   Procedure: COLONOSCOPY WITH PROPOFOL ;  Surgeon: Jinny Carmine, MD;  Location[facility];  Service: Endoscopy;  Laterality: N/A;   none      Medications  Current Outpatient Medications on File Prior to Visit  Medication Sig Dispense Refill   clobetasol  cream (TEMOVATE ) 0.05 % Apply to affected area 2 times daily as needed up to three days per week. Avoid applying to face, groin, and axilla. Use as directed. Long-term use can cause thinning of the skin. 60 g 3   clobetasol  ointment (TEMOVATE ) 0.05 % Apply 1 Application topically 2 (two) times daily. Apply twice daily to lower back and behind ears for 2 weeks then stop. Restart in 2 weeks as needed 30 g 0   mupirocin  ointment (BACTROBAN ) 2 % Apply 1 Application topically 2 (two) times daily. 22 g 2   Roflumilast  (ZORYVE ) 0.3 % CREA Apply topiacally to affected area(s) once daily. 60 g 5    Allergies No Known Allergies  Family History Family History  Problem Relation Age of Onset   Diabetes Mother    Hypertension Mother    Obesity Mother    Crohn's disease Sister    Lung disease Neg Hx    Rheumatologic disease Neg Hx    Cancer Neg Hx     Review of Systems: Constitutional:  no fevers Eye:  no recent significant change in vision Ear/Nose/Mouth/Throat:  Ears:  no hearing loss Nose/Mouth/Throat:  no  complaints of nasal congestion, no sore throat Cardiovascular:  no chest pain Respiratory:  no shortness of breath Gastrointestinal:  no change in bowel habits GU:  Male: negative for dysuria, frequency Musculoskeletal/Extremities:  no joint pain Integumentary (Skin/Breast):  no abnormal skin lesions reported Neurologic:  no headaches Endocrine: No unexpected weight changes Hematologic/Lymphatic:  no abnormal bleeding  Exam BP 132/84 (BP Location: Left Arm, Patient Position: Sitting)   Pulse 83   Temp 98 F (36.7 C) (Oral)   Resp 16   Ht 6' 5 (1.956 m)   Wt (!) 406 lb 6.4 oz (184.3 kg)   SpO2 96%   BMI 48.19 kg/m  General:  well developed, well nourished, in no apparent distress Skin:  no significant moles, warts, or growths Head:  no masses, lesions, or tenderness Eyes:  pupils equal and round, sclera anicteric without injection Ears:  canals without lesions, TMs shiny without retraction, no obvious effusion, no erythema Nose:  nares patent, mucosa normal Throat/Pharynx:  lips and gingiva without lesion; tongue and uvula midline; non-inflamed pharynx; no exudates or postnasal drainage Neck: neck supple without adenopathy, thyromegaly, or masses Cardiac: RRR, no bruits, no LE edema Lungs:  clear to auscultation, breath sounds equal bilaterally, no respiratory distress Abdomen: BS+, soft, non-tender, non-distended, no masses or organomegaly noted Rectal: Deferred Musculoskeletal:  symmetrical muscle  groups noted without atrophy or deformity Neuro:  gait normal; deep tendon reflexes normal and symmetric Psych: well oriented with normal range of affect and appropriate judgment/insight  Assessment and Plan  Well adult exam - Plan: CBC, Comprehensive metabolic panel with GFR, Lipid panel  Screening for prostate cancer - Plan: PSA  Prediabetes - Plan: Hemoglobin A1c   Well 58 y.o. male. Counseled on diet and exercise. Shingrix recommended. PCV 20 politely declined.   Encouraged him to really think about this 1. Advanced directive form requested. Counseled on risks and benefits of prostate cancer screening with PSA. The patient agrees to undergo testing. Immunizations, labs, and further orders as above. Follow up in 1 year. The patient voiced understanding and agreement to the plan.  Mabel [place], DO 08/06/23 10:24 AM

## 2023-08-06 NOTE — Patient Instructions (Signed)
 Give us  2-3 business days to get the results of your labs back.    Please give thought to the pneumonia vaccination. You can get this at the pharmacy.   Keep the diet clean and stay active.  Please get me a copy of your advanced directive form at your convenience.   Let us  know if you need anything.

## 2023-08-31 DIAGNOSIS — G4733 Obstructive sleep apnea (adult) (pediatric): Secondary | ICD-10-CM | POA: Diagnosis not present

## 2023-11-24 ENCOUNTER — Ambulatory Visit: Admitting: Dermatology

## 2023-11-24 ENCOUNTER — Other Ambulatory Visit (HOSPITAL_COMMUNITY): Payer: Self-pay

## 2023-11-24 DIAGNOSIS — L409 Psoriasis, unspecified: Secondary | ICD-10-CM | POA: Diagnosis not present

## 2023-11-24 DIAGNOSIS — L820 Inflamed seborrheic keratosis: Secondary | ICD-10-CM

## 2023-11-24 DIAGNOSIS — Z7189 Other specified counseling: Secondary | ICD-10-CM | POA: Diagnosis not present

## 2023-11-24 DIAGNOSIS — I781 Nevus, non-neoplastic: Secondary | ICD-10-CM

## 2023-11-24 DIAGNOSIS — L989 Disorder of the skin and subcutaneous tissue, unspecified: Secondary | ICD-10-CM | POA: Diagnosis not present

## 2023-11-24 DIAGNOSIS — Z79899 Other long term (current) drug therapy: Secondary | ICD-10-CM

## 2023-11-24 DIAGNOSIS — L249 Irritant contact dermatitis, unspecified cause: Secondary | ICD-10-CM

## 2023-11-24 DIAGNOSIS — D224 Melanocytic nevi of scalp and neck: Secondary | ICD-10-CM

## 2023-11-24 MED ORDER — ZORYVE 0.3 % EX CREA: TOPICAL_CREAM | CUTANEOUS | 5 refills | Status: AC

## 2023-11-24 MED ORDER — MOMETASONE FUROATE 0.1 % EX CREA
TOPICAL_CREAM | CUTANEOUS | 1 refills | Status: AC
  Filled 2023-11-24: qty 45, 90d supply, fill #0

## 2023-11-24 NOTE — Patient Instructions (Addendum)
 Your prescription was sent to [facility] in [place]. A representative from [facility] will contact you within 3 business hours to verify your address and insurance information to schedule a free delivery. If for any reason you do not receive a phone call from them, please reach out to them. Their phone number is [phone] and their hours are Monday-Friday 9:00 am-5:00 pm.     Due to recent changes in healthcare laws, you may see results of your pathology and/or laboratory studies on MyChart before the doctors have had a chance to review them. We understand that in some cases there may be results that are confusing or concerning to you. Please understand that not all results are received at the same time and often the doctors may need to interpret multiple results in order to provide you with the best plan of care or course of treatment. Therefore, we ask that you please give us  2 business days to thoroughly review all your results before contacting the office for clarification. Should we see a critical lab result, you will be contacted sooner.   If You Need Anything After Your Visit  If you have any questions or concerns for your doctor, please call our main line at [phone] and press option 4 to reach your doctor's medical assistant. If no one answers, please leave a voicemail as directed and we will return your call as soon as possible. Messages left after 4 pm will be answered the following business day.   You may also send us  a message via MyChart. We typically respond to MyChart messages within 1-2 business days.  For prescription refills, please ask your pharmacy to contact our office. Our fax number is [phone].  If you have an urgent issue when the clinic is closed that cannot wait until the next business day, you can page your doctor at the number below.    Please note that while we do our best to be available for urgent issues outside of office hours, we are not  available 24/7.   If you have an urgent issue and are unable to reach us , you may choose to seek medical care at your doctor's office, retail clinic, urgent care center, or emergency room.  If you have a medical emergency, please immediately call 911 or go to the emergency department.  Pager Numbers  - Dr. Hester: [phone]  - Dr. Jackquline: [phone]  - Dr. Claudene: [phone]   - Dr. Raymund: [phone]  In the event of inclement weather, please call our main line at [phone] for an update on the status of any delays or closures.  Dermatology Medication Tips: Please keep the boxes that topical medications come in in order to help keep track of the instructions about where and how to use these. Pharmacies typically print the medication instructions only on the boxes and not directly on the medication tubes.   If your medication is too expensive, please contact our office at [phone] option 4 or send us  a message through MyChart.   We are unable to tell what your co-pay for medications will be in advance as this is different depending on your insurance coverage. However, we may be able to find a substitute medication at lower cost or fill out paperwork to get insurance to cover a needed medication.   If a prior authorization is required to get your medication covered by your insurance company, please allow us  1-2 business days to complete this process.  Drug prices often vary depending  on where the prescription is filled and some pharmacies may offer cheaper prices.  The website www.goodrx.com contains coupons for medications through different pharmacies. The prices here do not account for what the cost may be with help from insurance (it may be cheaper with your insurance), but the website can give you the price if you did not use any insurance.  - You can print the associated coupon and take it with your prescription to the pharmacy.  - You may also stop by our office  during regular business hours and pick up a GoodRx coupon card.  - If you need your prescription sent electronically to a different pharmacy, notify our office through [facility] or by phone at [phone] option 4.     Si Usted Necesita Algo Despus de Su Visita  Tambin puede enviarnos un mensaje a travs de Clinical cytogeneticist. Por lo general respondemos a los mensajes de MyChart en el transcurso de 1 a 2 das hbiles.  Para renovar recetas, por favor pida a su farmacia que se ponga en contacto con nuestra oficina. Randi lakes de fax es [place] [phone].  Si tiene un asunto urgente cuando la clnica est cerrada y que no puede esperar hasta el siguiente da hbil, puede llamar/localizar a su doctor(a) al nmero que aparece a continuacin.   Por favor, tenga en cuenta que aunque hacemos todo lo posible para estar disponibles para asuntos urgentes fuera del horario de [place], no estamos disponibles las 24 horas del da, los 7 [address]  [address] de la [place].   Si tiene un problema urgente y no puede comunicarse con nosotros, puede optar por buscar atencin mdica  en el consultorio de su doctor(a), en una clnica privada, en un centro de atencin urgente o en una sala de emergencias.  Si tiene Engineer, drilling, por favor llame inmediatamente al 911 o vaya a la sala de emergencias.  Nmeros de bper  - Dr. Hester: [phone]  - Dra. Jackquline: [phone]  - Dr. Claudene: [phone]  - Dra. Kitts: [phone]  En caso de inclemencias del [place], por favor llame a nuestra lnea principal al [phone] para una actualizacin sobre el estado de cualquier retraso o cierre.  Consejos para la medicacin en dermatologa: Por favor, guarde las cajas en las que vienen los medicamentos de uso tpico para ayudarle a seguir las instrucciones sobre dnde y cmo usarlos. Las farmacias generalmente imprimen las instrucciones del medicamento slo en las cajas y no directamente en los tubos del  [place].   Si su medicamento es muy caro, por favor, pngase en contacto con landry rieger llamando al [phone] y presione la opcin 4 o envenos un mensaje a travs de Clinical cytogeneticist.   No podemos decirle cul ser su copago por los medicamentos por adelantado ya que esto es diferente dependiendo de la cobertura de su seguro. Sin embargo, es posible que podamos encontrar un medicamento sustituto a Audiological scientist un formulario para que el seguro cubra el medicamento que se considera necesario.   Si se requiere una autorizacin previa para que su compaa de seguros malta su medicamento, por favor permtanos de 1 a 2 das hbiles para completar este proceso.  Los precios de los medicamentos varan con frecuencia dependiendo del Environmental consultant de dnde se surte la receta y alguna farmacias pueden ofrecer precios ms baratos.  El sitio web www.goodrx.com tiene cupones para medicamentos de Health and safety inspector. Los precios aqu no tienen en cuenta lo que podra costar con la ayuda del seguro (puede ser ms  barato con su seguro), pero el sitio web puede darle el precio si no Visual merchandiser.  - Puede imprimir el cupn correspondiente y llevarlo con su receta a la farmacia.  - Tambin puede pasar por nuestra oficina durante el horario de atencin regular y Education officer, museum una tarjeta de cupones de GoodRx.  - Si necesita que su receta se enve electrnicamente a una farmacia diferente, informe a nuestra oficina a travs de MyChart de [place] o por telfono llamando al [phone] y presione la opcin 4.

## 2023-11-24 NOTE — Progress Notes (Signed)
 Follow-Up Visit   Subjective  Timothy Ho is a 57 y.o. male who presents for the following: Psoriasis - currently using clobetasol  0.05% ointment and Mupirocin  2% ointment, Zoryve  was not covered by insurance and he was unable to get it through [facility].  Pt's wife noticed an irregular appearing skin lesion on the L lower abdomen that has changed in appearance and she would like checked today.  The patient has spots, moles and lesions to be evaluated, some may be new or changing and the patient may have concern these could be cancer.  The following portions of the chart were reviewed this encounter and updated as appropriate: medications, allergies, medical history  Review of Systems:  No other skin or systemic complaints except as noted in HPI or Assessment and Plan.  Objective  Well appearing patient in no apparent distress; mood and affect are within normal limits.  Areas Examined: The face, ears, scalp, and back  Relevant exam findings are noted in the Assessment and Plan.    LLQA x 1 Erythematous stuck-on, waxy papule or plaque  Assessment & Plan   PSORIASIS   Related Medications Roflumilast  (ZORYVE ) 0.3 % CREA Apply 2 grams twice daily to affected areas of skin mometasone  (ELOCON ) 0.1 % cream Apply to affected area (rash) once-twice daily up to 5 days a week. May use on the face/ear/neck. INFLAMED SEBORRHEIC KERATOSIS LLQA x 1 Symptomatic, irritating, patient would like treated. If not resolved after 4-6 weeks RTC for bx.  Destruction of lesion - LLQA x 1 Complexity: simple   Destruction method: cryotherapy   Informed consent: discussed and consent obtained   Timeout:  patient name, date of birth, surgical site, and procedure verified Lesion destroyed using liquid nitrogen: Yes   Region frozen until ice ball extended beyond lesion: Yes   Outcome: patient tolerated procedure well with no complications   Post-procedure details: wound care instructions  given    COUNSELING AND COORDINATION OF CARE   MEDICATION MANAGEMENT   TELANGIECTASIA   IRRITANT CONTACT DERMATITIS, UNSPECIFIED TRIGGER   MELANOCYTIC NEVUS OF NECK    PSORIASIS/sebopsoriasis Well-demarcated erythematous papules/plaques with silvery scale, guttate pink scaly papules.  10% BSA. Chronic and persistent condition with duration or expected duration over one year. Condition is symptomatic/ bothersome to patient. Not currently at goal. Treatment Plan: May use clobetasol  0.05% ointment to aa's back QD-BID 3d/wk. Do not use on aa's face/ears/neck. Topical steroids (such as triamcinolone, fluocinolone, fluocinonide, mometasone , clobetasol , halobetasol, betamethasone , hydrocortisone) can cause thinning and lightening of the skin if they are used for too long in the same area. Your physician has selected the right strength medicine for your problem and area affected on the body. Please use your medication only as directed by your physician to prevent side effects.   Will resend Zorvye cream to [facility] to apply to aa QD PRN. If unable to get at an affordable price consider Vtama or vitamin D  analog topically. Start Mometasone  0.1% cream to aa's face/ears/neck QD-BID PRN up to 5d/wk.  Counseling on psoriasis and coordination of care  psoriasis is a chronic non-curable, but treatable genetic/hereditary disease that may have other systemic features affecting other organ systems such as joints (Psoriatic Arthritis). It is associated with an increased risk of inflammatory bowel disease, heart disease, non-alcoholic fatty liver disease, and depression.  Treatments include light and laser treatments; topical medications; and systemic medications including oral and injectables.  Irritant Contact Dermatitis vs psoriasis/sebopsoriasis Exam: Scaly pink papules coalescing to plaques - postauricular  areas, neck Treatment Plan: Start Mometasone  0.1% cream to aa's BID PRN up to 5d/wk.  Topical steroids (such as triamcinolone, fluocinolone, fluocinonide, mometasone , clobetasol , halobetasol, betamethasone , hydrocortisone) can cause thinning and lightening of the skin if they are used for too long in the same area. Your physician has selected the right strength medicine for your problem and area affected on the body. Please use your medication only as directed by your physician to prevent side effects.   TELANGIECTASIA Exam: dilated blood vessel(s) L nose supratip Treatment Plan: Benign appearing on exam Call for changes. Counseling for BBL / IPL / Laser and Coordination of Care Discussed the treatment option of Broad Band Light (BBL) /Intense Pulsed Light (IPL)/ Laser for skin discoloration, including brown spots and redness.  Typically we recommend at least 1-3 treatment sessions about 5-8 weeks apart for best results.  Cannot have tanned skin when BBL performed, and regular use of sunscreen/photoprotection is advised after the procedure to help maintain results. The patient's condition may also require maintenance treatments in the future.  The fee for BBL / laser treatments is $350 per treatment session for the whole face.  A fee can be quoted for other parts of the body.  Insurance typically does not pay for BBL/laser treatments and therefore the fee is an out-of-pocket cost. Recommend prophylactic valtrex treatment. Once scheduled for procedure, will send Rx in prior to patient's appointment.   MELANOCYTIC NEVUS - L post neck Exam: Tan-brown and/or pink-flesh-colored symmetric macules and papules Treatment Plan: Benign appearing on exam today. Recommend observation. Call clinic for new or changing moles. Recommend daily use of broad spectrum spf 30+ sunscreen to sun-exposed areas.    Return if symptoms worsen or fail to improve.  LILLETTE Rosina Mayans, CMA, am acting as scribe for Alm Rhyme, MD .   Documentation: I have reviewed the above documentation for accuracy and  completeness, and I agree with the above.  Alm Rhyme, MD

## 2023-11-29 ENCOUNTER — Encounter: Payer: Self-pay | Admitting: Dermatology

## 2023-12-06 ENCOUNTER — Other Ambulatory Visit (HOSPITAL_COMMUNITY): Payer: Self-pay

## 2023-12-17 ENCOUNTER — Telehealth: Admitting: Physician Assistant

## 2023-12-17 DIAGNOSIS — L02213 Cutaneous abscess of chest wall: Secondary | ICD-10-CM

## 2023-12-17 MED ORDER — CEPHALEXIN 500 MG PO CAPS: 500.0000 mg | ORAL_CAPSULE | Freq: Four times a day (QID) | ORAL | 0 refills | Status: AC

## 2023-12-17 NOTE — Progress Notes (Signed)

## 2023-12-23 ENCOUNTER — Telehealth: Payer: Self-pay | Admitting: Family Medicine

## 2023-12-23 NOTE — Telephone Encounter (Signed)
 VM box full, sent mychart msg informing pt of need to reschedule 03/07/24 appt - NP out

## 2024-01-17 ENCOUNTER — Other Ambulatory Visit: Payer: Self-pay

## 2024-02-18 ENCOUNTER — Telehealth: Admitting: Family Medicine

## 2024-03-07 ENCOUNTER — Telehealth: Payer: Commercial Managed Care - PPO | Admitting: Family Medicine
